# Patient Record
Sex: Female | Born: 1980 | Race: White | Hispanic: No | Marital: Married | Smoking: Never smoker
Health system: Southern US, Community
[De-identification: ages and names within clinical notes are randomized; demographics above are authoritative.]

## PROBLEM LIST (undated history)

## (undated) ENCOUNTER — Inpatient Hospital Stay (HOSPITAL_COMMUNITY): Payer: Self-pay

## (undated) DIAGNOSIS — E669 Obesity, unspecified: Secondary | ICD-10-CM

## (undated) DIAGNOSIS — I499 Cardiac arrhythmia, unspecified: Secondary | ICD-10-CM

## (undated) HISTORY — PX: WISDOM TOOTH EXTRACTION: SHX21

## (undated) HISTORY — DX: Cardiac arrhythmia, unspecified: I49.9

## (undated) HISTORY — DX: Obesity, unspecified: E66.9

---

## 2011-09-26 ENCOUNTER — Other Ambulatory Visit: Payer: Self-pay | Admitting: Obstetrics & Gynecology

## 2011-09-26 DIAGNOSIS — Z0489 Encounter for examination and observation for other specified reasons: Secondary | ICD-10-CM

## 2011-10-03 ENCOUNTER — Other Ambulatory Visit: Payer: Self-pay | Admitting: Obstetrics & Gynecology

## 2011-10-03 ENCOUNTER — Encounter (HOSPITAL_COMMUNITY): Payer: Self-pay

## 2011-10-03 ENCOUNTER — Ambulatory Visit (HOSPITAL_COMMUNITY)
Admission: RE | Admit: 2011-10-03 | Discharge: 2011-10-03 | Disposition: A | Discharge status: Home / Self Care | Payer: 59 | Source: Ambulatory Visit | Attending: Obstetrics & Gynecology | Admitting: Obstetrics & Gynecology

## 2011-10-03 DIAGNOSIS — Z3689 Encounter for other specified antenatal screening: Secondary | ICD-10-CM | POA: Insufficient documentation

## 2011-10-03 DIAGNOSIS — Z0489 Encounter for examination and observation for other specified reasons: Secondary | ICD-10-CM

## 2011-10-03 LAB — OB RESULTS CONSOLE HIV ANTIBODY (ROUTINE TESTING): HIV: NONREACTIVE

## 2011-10-28 LAB — OB RESULTS CONSOLE ABO/RH

## 2011-10-28 LAB — OB RESULTS CONSOLE RUBELLA ANTIBODY, IGM: Rubella: IMMUNE

## 2011-11-21 ENCOUNTER — Other Ambulatory Visit: Payer: Self-pay | Admitting: Obstetrics & Gynecology

## 2011-11-21 DIAGNOSIS — IMO0002 Reserved for concepts with insufficient information to code with codable children: Secondary | ICD-10-CM

## 2011-11-28 ENCOUNTER — Telehealth (HOSPITAL_COMMUNITY): Payer: Self-pay | Admitting: *Deleted

## 2011-11-28 ENCOUNTER — Encounter (HOSPITAL_COMMUNITY): Payer: Self-pay | Admitting: *Deleted

## 2011-11-28 NOTE — Telephone Encounter (Signed)
Preadmission screen  

## 2011-11-30 ENCOUNTER — Ambulatory Visit (HOSPITAL_BASED_OUTPATIENT_CLINIC_OR_DEPARTMENT_OTHER): Payer: 59 | Attending: Nurse Practitioner

## 2011-11-30 VITALS — Ht 67.0 in | Wt 305.0 lb

## 2011-11-30 DIAGNOSIS — G4733 Obstructive sleep apnea (adult) (pediatric): Secondary | ICD-10-CM | POA: Insufficient documentation

## 2011-12-03 ENCOUNTER — Encounter (HOSPITAL_BASED_OUTPATIENT_CLINIC_OR_DEPARTMENT_OTHER): Payer: 59

## 2011-12-04 ENCOUNTER — Inpatient Hospital Stay (HOSPITAL_COMMUNITY): Admission: RE | Admit: 2011-12-04 | Payer: 59 | Source: Ambulatory Visit

## 2011-12-05 ENCOUNTER — Ambulatory Visit (HOSPITAL_COMMUNITY): Payer: 59

## 2011-12-06 DIAGNOSIS — G4733 Obstructive sleep apnea (adult) (pediatric): Secondary | ICD-10-CM

## 2011-12-06 NOTE — Procedures (Signed)
NAME:  Rose Meyer, Rose Meyer                 ACCOUNT NO.:  [id number]  MEDICAL RECORD NO.:  [id number]          PATIENT TYPE:  OUT  LOCATION:  SLEEP CENTER                 FACILITY:  [facility]  PHYSICIAN:  Clinton D. Maple Hudson, MD, FCCP, FACPDATE OF BIRTH:  January 22, 1979  DATE OF STUDY:  11/30/2011                           NOCTURNAL POLYSOMNOGRAM  REFERRING PHYSICIAN:  Dorise Hiss NEWSOME  REFERRING PHYSICIAN:  Dr. Willette Cluster.  INDICATION FOR STUDY:  Hypersomnia with sleep apnea.  EPWORTH SLEEPINESS SCORE:  11/24.  BMI 48.  Weight 305 pounds.  Height 67 inches.  Neck 16 inches.  MEDICATIONS:  Home medications are charted and reviewed.  SLEEP ARCHITECTURE:  Total sleep time 333 minutes with sleep efficiency 88.4%.  Stage I was 4.4%, stage II 76.7%, stage III 5.6%, REM 13.4% of total sleep time.  Sleep latency 6.5 minutes, REM latency 90.5 minutes, awake after sleep onset 31.5 minutes.  Arousal index 7.2.  BEDTIME MEDICATION:  Omeprazole.  RESPIRATORY DATA:  Apnea-hypopnea index (AHI) 11.2 per hour.  A total of 62 events was scored including 14 obstructive apneas and 48 hypopneas. Events were seen in all sleep positions.  REM AHI 35.1 per hour.  This was a diagnostic NPSG protocol and CPAP titration was not done.  OXYGEN DATA:  Loud snoring with oxygen desaturation to a nadir of 83% and mean oxygen saturation through the study of 94.5% on room air.  CARDIAC DATA:  Sinus rhythm.  MOVEMENT-PARASOMNIA:  No significant movement disturbance.  Bathroom x1.  IMPRESSIONS-RECOMMENDATIONS: 1. Mild obstructive sleep apnea/hypopnea syndrome, AHI 11.2 per hour     with non-positional events.  Loud snoring with oxygen desaturation     to a nadir of 83% and mean oxygen saturation through the study of     94.5% on room air. 2. This was a diagnostic NPSG protocol without CPAP titration.     Consider return for dedicated CPAP titration study through the     sleep disorder center if  appropriate.     Clinton D. Maple Hudson, MD, [facility], FACP Diplomate, American Board of Sleep Medicine    CDY/MEDQ  D:  12/06/2011 11:20:57  T:  12/06/2011 22:00:43  Job:  [id number]

## 2011-12-17 ENCOUNTER — Inpatient Hospital Stay (HOSPITAL_COMMUNITY)
Admission: AD | Admit: 2011-12-17 | Discharge: 2011-12-17 | Disposition: A | Discharge status: Home / Self Care | Payer: 59 | Source: Ambulatory Visit | Attending: Obstetrics | Admitting: Obstetrics

## 2011-12-17 ENCOUNTER — Inpatient Hospital Stay (HOSPITAL_COMMUNITY): Payer: 59

## 2011-12-17 ENCOUNTER — Encounter (HOSPITAL_COMMUNITY): Payer: Self-pay | Admitting: *Deleted

## 2011-12-17 DIAGNOSIS — O322XX0 Maternal care for transverse and oblique lie, not applicable or unspecified: Secondary | ICD-10-CM | POA: Insufficient documentation

## 2011-12-17 DIAGNOSIS — O479 False labor, unspecified: Secondary | ICD-10-CM | POA: Insufficient documentation

## 2011-12-17 NOTE — MAU Note (Signed)
Dr. Tamela Oddi notified pt back from u/s, baby transverse lie, head at maternal left. Orders to d/c home, f/u in office any day this week.

## 2011-12-17 NOTE — MAU Note (Signed)
Dr. Tamela Oddi notified pt in MAU for labor eval, cervix ft, 40%, not contracting, unsure of presentation, order for u/s for presentation.

## 2011-12-17 NOTE — MAU Note (Signed)
Pt states pain began 2300, baby was vertex then felt sharp movement ?this past week. Denies bleeding or lof.

## 2011-12-17 NOTE — MAU Note (Signed)
PT SAYS  SHE STARTED HURT BAD AT 2300.   NOT BEEN IN MAU AT ALL. NO VE IN  OFFICE.   DENIES HSV  AND MRSA. 00

## 2011-12-18 ENCOUNTER — Inpatient Hospital Stay (HOSPITAL_COMMUNITY): Payer: 59

## 2011-12-18 ENCOUNTER — Encounter (HOSPITAL_COMMUNITY): Payer: Self-pay

## 2011-12-18 ENCOUNTER — Encounter (HOSPITAL_COMMUNITY)
Admission: RE | Disposition: A | Discharge status: Home / Self Care | Payer: Self-pay | Source: Ambulatory Visit | Attending: Obstetrics & Gynecology

## 2011-12-18 ENCOUNTER — Inpatient Hospital Stay (HOSPITAL_COMMUNITY)
Admission: RE | Admit: 2011-12-18 | Discharge: 2011-12-22 | DRG: 766 | Disposition: A | Discharge status: Home / Self Care | Payer: 59 | Source: Ambulatory Visit | Attending: Obstetrics & Gynecology | Admitting: Obstetrics & Gynecology

## 2011-12-18 ENCOUNTER — Encounter (HOSPITAL_COMMUNITY): Payer: Self-pay | Admitting: Anesthesiology

## 2011-12-18 ENCOUNTER — Inpatient Hospital Stay (HOSPITAL_COMMUNITY): Payer: 59 | Admitting: Anesthesiology

## 2011-12-18 VITALS — BP 106/69 | HR 72 | Temp 98.2°F | Resp 20 | Ht 67.0 in | Wt 305.8 lb

## 2011-12-18 DIAGNOSIS — O99892 Other specified diseases and conditions complicating childbirth: Secondary | ICD-10-CM | POA: Diagnosis present

## 2011-12-18 DIAGNOSIS — IMO0001 Reserved for inherently not codable concepts without codable children: Secondary | ICD-10-CM | POA: Diagnosis not present

## 2011-12-18 DIAGNOSIS — I471 Supraventricular tachycardia, unspecified: Secondary | ICD-10-CM | POA: Diagnosis present

## 2011-12-18 DIAGNOSIS — O34219 Maternal care for unspecified type scar from previous cesarean delivery: Secondary | ICD-10-CM | POA: Diagnosis present

## 2011-12-18 DIAGNOSIS — O321XX0 Maternal care for breech presentation, not applicable or unspecified: Secondary | ICD-10-CM | POA: Diagnosis not present

## 2011-12-18 DIAGNOSIS — O322XX0 Maternal care for transverse and oblique lie, not applicable or unspecified: Principal | ICD-10-CM | POA: Diagnosis present

## 2011-12-18 DIAGNOSIS — I498 Other specified cardiac arrhythmias: Secondary | ICD-10-CM | POA: Diagnosis present

## 2011-12-18 DIAGNOSIS — O480 Post-term pregnancy: Secondary | ICD-10-CM | POA: Diagnosis present

## 2011-12-18 LAB — CBC
HCT: 37.2 % (ref 36.0–46.0)
Hemoglobin: 12.6 g/dL (ref 12.0–15.0)
RBC: 4.07 MIL/uL (ref 3.87–5.11)

## 2011-12-18 LAB — RPR: RPR Ser Ql: NONREACTIVE

## 2011-12-18 SURGERY — Surgical Case
Anesthesia: Epidural | Site: Abdomen | Wound class: Clean Contaminated

## 2011-12-18 MED ORDER — METOCLOPRAMIDE HCL 5 MG/ML IJ SOLN: 10.0000 mg | Freq: Three times a day (TID) | INTRAMUSCULAR | Status: DC | PRN

## 2011-12-18 MED ORDER — ZOLPIDEM TARTRATE 5 MG PO TABS: 5.0000 mg | ORAL_TABLET | Freq: Every evening | ORAL | Status: DC | PRN

## 2011-12-18 MED ORDER — ACEBUTOLOL HCL 200 MG PO CAPS
200.0000 mg | ORAL_CAPSULE | Freq: Every day | ORAL | Status: DC
  Filled 2011-12-18: qty 1

## 2011-12-18 MED ORDER — SCOPOLAMINE 1 MG/3DAYS TD PT72
MEDICATED_PATCH | TRANSDERMAL | Status: AC
  Filled 2011-12-18: qty 1

## 2011-12-18 MED ORDER — ONDANSETRON HCL 4 MG/2ML IJ SOLN
INTRAMUSCULAR | Status: AC
  Filled 2011-12-18: qty 2

## 2011-12-18 MED ORDER — MEPERIDINE HCL 25 MG/ML IJ SOLN
6.2500 mg | INTRAMUSCULAR | Status: AC | PRN
  Administered 2011-12-18 (×2): 6.25 mg via INTRAVENOUS

## 2011-12-18 MED ORDER — OXYTOCIN 10 UNIT/ML IJ SOLN
INTRAMUSCULAR | Status: AC
  Filled 2011-12-18: qty 1

## 2011-12-18 MED ORDER — DEXTROSE 5 % IV SOLN
3.0000 g | Freq: Once | INTRAVENOUS | Status: DC
  Filled 2011-12-18: qty 3000

## 2011-12-18 MED ORDER — NALBUPHINE HCL 10 MG/ML IJ SOLN
5.0000 mg | INTRAMUSCULAR | Status: DC | PRN
  Administered 2011-12-19 (×3): 5 mg via INTRAVENOUS
  Filled 2011-12-18 (×2): qty 1

## 2011-12-18 MED ORDER — MEPERIDINE HCL 25 MG/ML IJ SOLN
INTRAMUSCULAR | Status: AC
  Filled 2011-12-18: qty 1

## 2011-12-18 MED ORDER — EPHEDRINE 5 MG/ML INJ
10.0000 mg | INTRAVENOUS | Status: DC | PRN
  Filled 2011-12-18: qty 4

## 2011-12-18 MED ORDER — ACETAMINOPHEN 325 MG PO TABS: 650.0000 mg | ORAL_TABLET | ORAL | Status: DC | PRN

## 2011-12-18 MED ORDER — SODIUM BICARBONATE 8.4 % IV SOLN
INTRAVENOUS | Status: AC
  Filled 2011-12-18: qty 50

## 2011-12-18 MED ORDER — PHENYLEPHRINE 40 MCG/ML (10ML) SYRINGE FOR IV PUSH (FOR BLOOD PRESSURE SUPPORT)
80.0000 ug | PREFILLED_SYRINGE | INTRAVENOUS | Status: DC | PRN
  Filled 2011-12-18: qty 5

## 2011-12-18 MED ORDER — SIMETHICONE 80 MG PO CHEW: 80.0000 mg | CHEWABLE_TABLET | ORAL | Status: DC | PRN

## 2011-12-18 MED ORDER — IBUPROFEN 600 MG PO TABS
600.0000 mg | ORAL_TABLET | Freq: Four times a day (QID) | ORAL | Status: DC
  Administered 2011-12-19 (×3): 600 mg via ORAL
  Filled 2011-12-18 (×3): qty 1

## 2011-12-18 MED ORDER — NALOXONE HCL 1 MG/ML IJ SOLN: 1.0000 ug/kg/h | INTRAMUSCULAR | Status: DC | PRN

## 2011-12-18 MED ORDER — ONDANSETRON HCL 4 MG PO TABS: 4.0000 mg | ORAL_TABLET | ORAL | Status: DC | PRN

## 2011-12-18 MED ORDER — MORPHINE SULFATE (PF) 0.5 MG/ML IJ SOLN
INTRAMUSCULAR | Status: DC | PRN
  Administered 2011-12-18: 4 mg via EPIDURAL

## 2011-12-18 MED ORDER — ONDANSETRON HCL 4 MG/2ML IJ SOLN: 4.0000 mg | Freq: Three times a day (TID) | INTRAMUSCULAR | Status: DC | PRN

## 2011-12-18 MED ORDER — ONDANSETRON HCL 4 MG/2ML IJ SOLN: 4.0000 mg | INTRAMUSCULAR | Status: DC | PRN

## 2011-12-18 MED ORDER — IBUPROFEN 600 MG PO TABS: 600.0000 mg | ORAL_TABLET | Freq: Four times a day (QID) | ORAL | Status: DC | PRN

## 2011-12-18 MED ORDER — OXYTOCIN 40 UNITS IN LACTATED RINGERS INFUSION - SIMPLE MED
1.0000 m[IU]/min | INTRAVENOUS | Status: DC
  Administered 2011-12-18: 2 m[IU]/min via INTRAVENOUS
  Filled 2011-12-18: qty 1000

## 2011-12-18 MED ORDER — LACTATED RINGERS IV SOLN
INTRAVENOUS | Status: DC
  Administered 2011-12-18 (×2): via INTRAVENOUS

## 2011-12-18 MED ORDER — KETOROLAC TROMETHAMINE 60 MG/2ML IM SOLN
60.0000 mg | Freq: Once | INTRAMUSCULAR | Status: AC | PRN
  Administered 2011-12-18: 60 mg via INTRAMUSCULAR

## 2011-12-18 MED ORDER — NALOXONE HCL 0.4 MG/ML IJ SOLN: 0.4000 mg | INTRAMUSCULAR | Status: DC | PRN

## 2011-12-18 MED ORDER — MEPERIDINE HCL 25 MG/ML IJ SOLN
INTRAMUSCULAR | Status: DC | PRN
  Administered 2011-12-18: 12.5 mg via INTRAVENOUS

## 2011-12-18 MED ORDER — ACEBUTOLOL HCL 400 MG PO CAPS
400.0000 mg | ORAL_CAPSULE | Freq: Every day | ORAL | Status: DC
  Filled 2011-12-18: qty 1

## 2011-12-18 MED ORDER — OXYTOCIN 40 UNITS IN LACTATED RINGERS INFUSION - SIMPLE MED: 62.5000 mL/h | INTRAVENOUS | Status: DC

## 2011-12-18 MED ORDER — MENTHOL 3 MG MT LOZG: 1.0000 | LOZENGE | OROMUCOSAL | Status: DC | PRN

## 2011-12-18 MED ORDER — LIDOCAINE HCL (PF) 1 % IJ SOLN
INTRAMUSCULAR | Status: DC | PRN
  Administered 2011-12-18 (×2): 4 mL

## 2011-12-18 MED ORDER — FENTANYL 2.5 MCG/ML BUPIVACAINE 1/10 % EPIDURAL INFUSION (WH - ANES)
INTRAMUSCULAR | Status: DC | PRN
  Administered 2011-12-18: 14 mL/h via EPIDURAL

## 2011-12-18 MED ORDER — MORPHINE SULFATE 0.5 MG/ML IJ SOLN
INTRAMUSCULAR | Status: AC
  Filled 2011-12-18: qty 10

## 2011-12-18 MED ORDER — MORPHINE SULFATE (PF) 0.5 MG/ML IJ SOLN
INTRAMUSCULAR | Status: DC | PRN
  Administered 2011-12-18: .5 mg via INTRAVENOUS
  Administered 2011-12-18: .5 mg via EPIDURAL

## 2011-12-18 MED ORDER — LACTATED RINGERS IV SOLN
500.0000 mL | Freq: Once | INTRAVENOUS | Status: AC
  Administered 2011-12-18: 500 mL via INTRAVENOUS

## 2011-12-18 MED ORDER — LIDOCAINE HCL (PF) 1 % IJ SOLN: 30.0000 mL | INTRAMUSCULAR | Status: DC | PRN

## 2011-12-18 MED ORDER — TETANUS-DIPHTH-ACELL PERTUSSIS 5-2.5-18.5 LF-MCG/0.5 IM SUSP
0.5000 mL | Freq: Once | INTRAMUSCULAR | Status: DC
  Filled 2011-12-18: qty 0.5

## 2011-12-18 MED ORDER — SODIUM CHLORIDE 0.9 % IJ SOLN
3.0000 mL | INTRAMUSCULAR | Status: DC | PRN
  Administered 2011-12-19 (×2): 3 mL via INTRAVENOUS

## 2011-12-18 MED ORDER — DIPHENHYDRAMINE HCL 50 MG/ML IJ SOLN: 25.0000 mg | INTRAMUSCULAR | Status: DC | PRN

## 2011-12-18 MED ORDER — SCOPOLAMINE 1 MG/3DAYS TD PT72
1.0000 | MEDICATED_PATCH | Freq: Once | TRANSDERMAL | Status: AC
  Administered 2011-12-18: 1.5 mg via TRANSDERMAL

## 2011-12-18 MED ORDER — DEXTROSE 5 % IV SOLN
3.0000 g | INTRAVENOUS | Status: DC | PRN
  Administered 2011-12-18: 3 g via INTRAVENOUS

## 2011-12-18 MED ORDER — LACTATED RINGERS IV SOLN
INTRAVENOUS | Status: DC | PRN
  Administered 2011-12-18 (×2): via INTRAVENOUS

## 2011-12-18 MED ORDER — DIPHENHYDRAMINE HCL 50 MG/ML IJ SOLN: 12.5000 mg | INTRAMUSCULAR | Status: DC | PRN

## 2011-12-18 MED ORDER — DIPHENHYDRAMINE HCL 25 MG PO CAPS: 25.0000 mg | ORAL_CAPSULE | ORAL | Status: DC | PRN

## 2011-12-18 MED ORDER — OXYCODONE-ACETAMINOPHEN 5-325 MG PO TABS
1.0000 | ORAL_TABLET | ORAL | Status: DC | PRN
  Administered 2011-12-19 (×4): 1 via ORAL
  Administered 2011-12-20 – 2011-12-22 (×13): 2 via ORAL
  Filled 2011-12-18: qty 1
  Filled 2011-12-18 (×4): qty 2
  Filled 2011-12-18: qty 1
  Filled 2011-12-18 (×7): qty 2
  Filled 2011-12-18: qty 1
  Filled 2011-12-18 (×2): qty 2
  Filled 2011-12-18: qty 1

## 2011-12-18 MED ORDER — CITRIC ACID-SODIUM CITRATE 334-500 MG/5ML PO SOLN
ORAL | Status: AC
  Filled 2011-12-18: qty 15

## 2011-12-18 MED ORDER — PHENYLEPHRINE HCL 10 MG/ML IJ SOLN
INTRAMUSCULAR | Status: DC | PRN
  Administered 2011-12-18 (×3): 40 ug via INTRAVENOUS
  Administered 2011-12-18 (×2): 80 ug via INTRAVENOUS
  Administered 2011-12-18 (×2): 40 ug via INTRAVENOUS

## 2011-12-18 MED ORDER — METHYLERGONOVINE MALEATE 0.2 MG/ML IJ SOLN
INTRAMUSCULAR | Status: AC
  Filled 2011-12-18: qty 1

## 2011-12-18 MED ORDER — DIBUCAINE 1 % RE OINT: 1.0000 "application " | TOPICAL_OINTMENT | RECTAL | Status: DC | PRN

## 2011-12-18 MED ORDER — FENTANYL 2.5 MCG/ML BUPIVACAINE 1/10 % EPIDURAL INFUSION (WH - ANES)
14.0000 mL/h | INTRAMUSCULAR | Status: DC
  Filled 2011-12-18: qty 125

## 2011-12-18 MED ORDER — LIDOCAINE-EPINEPHRINE (PF) 2 %-1:200000 IJ SOLN
INTRAMUSCULAR | Status: AC
  Filled 2011-12-18: qty 20

## 2011-12-18 MED ORDER — LANOLIN HYDROUS EX OINT: 1.0000 "application " | TOPICAL_OINTMENT | CUTANEOUS | Status: DC | PRN

## 2011-12-18 MED ORDER — FENTANYL CITRATE 0.05 MG/ML IJ SOLN: 25.0000 ug | INTRAMUSCULAR | Status: DC | PRN

## 2011-12-18 MED ORDER — ONDANSETRON HCL 4 MG/2ML IJ SOLN: 4.0000 mg | Freq: Four times a day (QID) | INTRAMUSCULAR | Status: DC | PRN

## 2011-12-18 MED ORDER — DIPHENHYDRAMINE HCL 25 MG PO CAPS: 25.0000 mg | ORAL_CAPSULE | Freq: Four times a day (QID) | ORAL | Status: DC | PRN

## 2011-12-18 MED ORDER — NALBUPHINE HCL 10 MG/ML IJ SOLN: 5.0000 mg | INTRAMUSCULAR | Status: DC | PRN

## 2011-12-18 MED ORDER — PHENYLEPHRINE 40 MCG/ML (10ML) SYRINGE FOR IV PUSH (FOR BLOOD PRESSURE SUPPORT): 80.0000 ug | PREFILLED_SYRINGE | INTRAVENOUS | Status: DC | PRN

## 2011-12-18 MED ORDER — ONDANSETRON HCL 4 MG/2ML IJ SOLN
INTRAMUSCULAR | Status: DC | PRN
  Administered 2011-12-18: 4 mg via INTRAVENOUS

## 2011-12-18 MED ORDER — EPHEDRINE 5 MG/ML INJ: 10.0000 mg | INTRAVENOUS | Status: DC | PRN

## 2011-12-18 MED ORDER — WITCH HAZEL-GLYCERIN EX PADS: 1.0000 "application " | MEDICATED_PAD | CUTANEOUS | Status: DC | PRN

## 2011-12-18 MED ORDER — KETOROLAC TROMETHAMINE 60 MG/2ML IM SOLN
INTRAMUSCULAR | Status: AC
  Filled 2011-12-18: qty 2

## 2011-12-18 MED ORDER — LACTATED RINGERS IV SOLN
INTRAVENOUS | Status: DC | PRN
  Administered 2011-12-18: 20:00:00 via INTRAVENOUS

## 2011-12-18 MED ORDER — LACTATED RINGERS IV SOLN: 500.0000 mL | INTRAVENOUS | Status: DC | PRN

## 2011-12-18 MED ORDER — SODIUM BICARBONATE 8.4 % IV SOLN
INTRAVENOUS | Status: DC | PRN
  Administered 2011-12-18 (×2): 5 mL via EPIDURAL

## 2011-12-18 MED ORDER — KETOROLAC TROMETHAMINE 30 MG/ML IJ SOLN: 30.0000 mg | Freq: Four times a day (QID) | INTRAMUSCULAR | Status: AC | PRN

## 2011-12-18 MED ORDER — FLEET ENEMA 7-19 GM/118ML RE ENEM: 1.0000 | ENEMA | RECTAL | Status: DC | PRN

## 2011-12-18 MED ORDER — OXYTOCIN 40 UNITS IN LACTATED RINGERS INFUSION - SIMPLE MED: 62.5000 mL/h | INTRAVENOUS | Status: AC

## 2011-12-18 MED ORDER — SENNOSIDES-DOCUSATE SODIUM 8.6-50 MG PO TABS
2.0000 | ORAL_TABLET | Freq: Every day | ORAL | Status: DC
  Administered 2011-12-19 – 2011-12-21 (×4): 2 via ORAL

## 2011-12-18 MED ORDER — CITRIC ACID-SODIUM CITRATE 334-500 MG/5ML PO SOLN
30.0000 mL | ORAL | Status: DC | PRN
  Administered 2011-12-18: 30 mL via ORAL

## 2011-12-18 MED ORDER — SIMETHICONE 80 MG PO CHEW
80.0000 mg | CHEWABLE_TABLET | Freq: Three times a day (TID) | ORAL | Status: DC
  Administered 2011-12-19 – 2011-12-22 (×14): 80 mg via ORAL

## 2011-12-18 MED ORDER — OXYCODONE-ACETAMINOPHEN 5-325 MG PO TABS: 1.0000 | ORAL_TABLET | ORAL | Status: DC | PRN

## 2011-12-18 MED ORDER — PRENATAL MULTIVITAMIN CH
1.0000 | ORAL_TABLET | Freq: Every day | ORAL | Status: DC
  Administered 2011-12-19 – 2011-12-22 (×4): 1 via ORAL
  Filled 2011-12-18 (×4): qty 1

## 2011-12-18 MED ORDER — LACTATED RINGERS IV SOLN
INTRAVENOUS | Status: DC
  Administered 2011-12-19 (×2): via INTRAVENOUS

## 2011-12-18 MED ORDER — OXYTOCIN 10 UNIT/ML IJ SOLN
40.0000 [IU] | INTRAVENOUS | Status: DC | PRN
  Administered 2011-12-18: 40 [IU] via INTRAVENOUS

## 2011-12-18 MED ORDER — OXYTOCIN BOLUS FROM INFUSION: 500.0000 mL | INTRAVENOUS | Status: DC

## 2011-12-18 SURGICAL SUPPLY — 42 items
BENZOIN TINCTURE PRP APPL 2/3 (GAUZE/BANDAGES/DRESSINGS) IMPLANT
CANISTER WOUND CARE 500ML ATS (WOUND CARE) IMPLANT
CLOTH BEACON ORANGE TIMEOUT ST (SAFETY) ×2 IMPLANT
CONTAINER PREFILL 10% NBF 15ML (MISCELLANEOUS) IMPLANT
DRESSING TELFA 8X3 (GAUZE/BANDAGES/DRESSINGS) ×2 IMPLANT
DRSG COVADERM 4X10 (GAUZE/BANDAGES/DRESSINGS) ×2 IMPLANT
DRSG VAC ATS LRG SENSATRAC (GAUZE/BANDAGES/DRESSINGS) ×2 IMPLANT
DRSG VAC ATS MED SENSATRAC (GAUZE/BANDAGES/DRESSINGS) IMPLANT
DRSG VAC ATS SM SENSATRAC (GAUZE/BANDAGES/DRESSINGS) IMPLANT
DURAPREP 26ML APPLICATOR (WOUND CARE) ×2 IMPLANT
ELECT REM PT RETURN 9FT ADLT (ELECTROSURGICAL) ×2
ELECTRODE REM PT RTRN 9FT ADLT (ELECTROSURGICAL) ×1 IMPLANT
EXTRACTOR VACUUM M CUP 4 TUBE (SUCTIONS) IMPLANT
GAUZE SPONGE 4X4 12PLY STRL LF (GAUZE/BANDAGES/DRESSINGS) ×4 IMPLANT
GAUZE VASELINE 3X9 (GAUZE/BANDAGES/DRESSINGS) ×2 IMPLANT
GLOVE BIO SURGEON STRL SZ 6.5 (GLOVE) ×4 IMPLANT
GOWN PREVENTION PLUS LG XLONG (DISPOSABLE) ×6 IMPLANT
KIT ABG SYR 3ML LUER SLIP (SYRINGE) IMPLANT
NEEDLE HYPO 25X5/8 SAFETYGLIDE (NEEDLE) ×2 IMPLANT
NS IRRIG 1000ML POUR BTL (IV SOLUTION) ×2 IMPLANT
PACK C SECTION WH (CUSTOM PROCEDURE TRAY) ×2 IMPLANT
PAD ABD 7.5X8 STRL (GAUZE/BANDAGES/DRESSINGS) ×2 IMPLANT
PAD OB MATERNITY 4.3X12.25 (PERSONAL CARE ITEMS) IMPLANT
RTRCTR C-SECT PINK 25CM LRG (MISCELLANEOUS) ×2 IMPLANT
SLEEVE SCD COMPRESS KNEE MED (MISCELLANEOUS) IMPLANT
STAPLER VISISTAT 35W (STAPLE) ×2 IMPLANT
STRIP CLOSURE SKIN 1/2X4 (GAUZE/BANDAGES/DRESSINGS) ×2 IMPLANT
SUT MNCRL 0 VIOLET CTX 36 (SUTURE) ×2 IMPLANT
SUT MNCRL AB 3-0 PS2 27 (SUTURE) IMPLANT
SUT MONOCRYL 0 CTX 36 (SUTURE) ×2
SUT PDS AB 0 CTX 36 PDP370T (SUTURE) ×2 IMPLANT
SUT PLAIN 0 NONE (SUTURE) IMPLANT
SUT PLAIN 2 0 XLH (SUTURE) ×2 IMPLANT
SUT VIC AB 0 CTXB 36 (SUTURE) IMPLANT
SUT VIC AB 2-0 CT1 (SUTURE) ×2 IMPLANT
SUT VIC AB 2-0 CT1 27 (SUTURE) ×1
SUT VIC AB 2-0 CT1 TAPERPNT 27 (SUTURE) ×1 IMPLANT
SUT VIC AB 2-0 SH 27 (SUTURE)
SUT VIC AB 2-0 SH 27XBRD (SUTURE) IMPLANT
TOWEL OR 17X24 6PK STRL BLUE (TOWEL DISPOSABLE) ×4 IMPLANT
TRAY FOLEY CATH 14FR (SET/KITS/TRAYS/PACK) ×2 IMPLANT
WATER STERILE IRR 1000ML POUR (IV SOLUTION) IMPLANT

## 2011-12-18 NOTE — Op Note (Signed)
Cesarean Section Procedure Note   Rose Meyer   12/18/2011  Indications: Transverse lie, shoulder presentation, arm prolapsing in the vagina   Pre-operative Diagnosis: Transverse lie, shoulder presentation, arm prolapsing in the vagina, IUP @ term, active labor  Post-operative Diagnosis: Same   Surgeon: Roseanna Rainbow  Assistants: Coral Ceo A  Anesthesia: epidural  Procedure Details:  The patient was seen in the Holding Room. The risks, benefits, complications, treatment options, and expected outcomes were discussed with the patient. The patient concurred with the proposed plan, giving informed consent. The patient was identified as Rose Meyer and the procedure verified as C-Section Delivery.  After induction of anesthesia, the patient was draped and prepped in the usual sterile manner. A transverse incision was made and carried down through the subcutaneous tissue to the fascia. The fascial incision was made and extended transversely.  The peritoneum was identified and entered. The peritoneal incision was extended longitudinally.  A low transverse uterine incision was made. Delivered as a complete extraction was a  living newborn female infant(s). APGAR (1 MIN): 5   APGAR (5 MINS): 9       A cord ph was sent. The umbilical cord was clamped and cut cord. A sample was obtained for evaluation. The placenta was removed Intact and appeared normal.  The uterine incision was closed with running locked sutures of 1-0 Monocryl. A bladder flap was sharply created.  A second imbricating layer of the same suture was placed.  Hemostasis was observed. The paracolic gutters were irrigated. The parieto peritoneum was closed in a running fashion with 2-0 Vicryl.  The fascia was then reapproximated with running sutures of 0 Vicryl.  Interrupted sutures of O-Plain were placed in the subcutaneous layer.  The skin was closed with staples.  A negative pressure dressing was applied.  Instrument, sponge,  and needle counts were correct prior the abdominal closure and were correct at the conclusion of the case.    Findings:  See above   Estimated Blood Loss: 400 ml  Total IV Fluids: per Anesthesiology  Urine Output: per Anesthesiology  Specimens: Placenta  Complications: no complications  Disposition: PACU - hemodynamically stable.  Maternal Condition: stable   Baby condition / location:  nursery-stable    Signed: Surgeon(s): Antionette Char, MD Brock Bad, MD

## 2011-12-18 NOTE — Transfer of Care (Signed)
Immediate Anesthesia Transfer of Care Note  Patient: Rose Meyer  Procedure(s) Performed: Procedure(s) (LRB) with comments: CESAREAN SECTION (N/A)  Patient Location: PACU  Anesthesia Type:Epidural  Level of Consciousness: awake, alert  and oriented  Airway & Oxygen Therapy: Patient Spontanous Breathing  Post-op Assessment: Report given to PACU RN and Post -op Vital signs reviewed and stable  Post vital signs: Reviewed and stable  Complications: No apparent anesthesia complications

## 2011-12-18 NOTE — Anesthesia Postprocedure Evaluation (Signed)
Anesthesia Post Note  Patient: Rose Meyer  Procedure(s) Performed: Procedure(s) (LRB): CESAREAN SECTION (N/A)  Anesthesia type: Epidural  Patient location: PACU  Post pain: Pain level controlled  Post assessment: Post-op Vital signs reviewed  Last Vitals:  Filed Vitals:   12/18/11 2315  BP: 133/77  Pulse: 89  Temp:   Resp: 19    Post vital signs: stable  Level of consciousness: awake  Complications: No apparent anesthesia complications

## 2011-12-18 NOTE — Progress Notes (Signed)
A speculum was placed.  The vagina was prepped with Betadine.  The speculum was removed.  A Cooper double balloon was placed in the LUS,  The uterine balloon was filled w/40 ml crystalloid, the vaginal balloon was filled with 20 ml.

## 2011-12-18 NOTE — Anesthesia Preprocedure Evaluation (Addendum)
Anesthesia Evaluation  Patient identified by MRN, date of birth, ID band Patient awake    Reviewed: Allergy & Precautions, H&P , Patient's Chart, lab work & pertinent test results  Airway Mallampati: III TM Distance: >3 FB Neck ROM: Full    Dental No notable dental hx. (+) Teeth Intact   Pulmonary neg pulmonary ROS,  breath sounds clear to auscultation  Pulmonary exam normal       Cardiovascular + dysrhythmias Supra Ventricular Tachycardia Rhythm:Regular Rate:Normal  Controlled on Acebutalol   Neuro/Psych negative neurological ROS  negative psych ROS   GI/Hepatic Neg liver ROS, GERD-  Medicated and Controlled,  Endo/Other  negative endocrine ROSMorbid obesity  Renal/GU negative Renal ROS  negative genitourinary   Musculoskeletal negative musculoskeletal ROS (+)   Abdominal (+) + obese,   Peds  Hematology negative hematology ROS (+)   Anesthesia Other Findings   Reproductive/Obstetrics Malpresentation - SROM                          Anesthesia Physical Anesthesia Plan  ASA: III and emergent  Anesthesia Plan: Epidural   Post-op Pain Management:    Induction:   Airway Management Planned: Natural Airway  Additional Equipment:   Intra-op Plan:   Post-operative Plan:   Informed Consent:   Dental advisory given  Plan Discussed with: Anesthesiologist, CRNA and Surgeon  Anesthesia Plan Comments:        Anesthesia Quick Evaluation

## 2011-12-18 NOTE — Progress Notes (Signed)
Rose Meyer is a 31 y.o. [id number] at [redacted]w[redacted]d by LMP admitted for induction of labor due to successful ECV.  Subjective: Comfortable  Objective: BP 135/80  Pulse 83  Temp 98.2 F (36.8 C) (Oral)  Resp 18  Ht 5\' 6"  (1.676 m)  Wt 141.976 kg (313 lb)  BMI 50.52 kg/m2  SpO2 98%  LMP 03/13/2011      FHT:  FHR: 140 bpm, variability: moderate,  accelerations:  Present,  decelerations:  Absent UC:   irregular, every 5 minutes SVE:   Dilation: 5 Effacement (%): 50 Station: Ballotable Exam by:: hk  Labs: Lab Results  Component Value Date   WBC 9.4 12/18/2011   HGB 12.6 12/18/2011   HCT 37.2 12/18/2011   MCV 91.4 12/18/2011   PLT 194 12/18/2011  U/S at bedside: breech presentation  Assessment / Plan: Breech presentation, SROM, meconium staining   Anticipated MOD:  C/D; informed consent obtained  JACKSON-MOORE,Srihari Shellhammer A 12/18/2011, 7:05 PM

## 2011-12-18 NOTE — Anesthesia Procedure Notes (Signed)
Epidural Patient location during procedure: OB Start time: 12/18/2011 6:33 PM  Staffing Anesthesiologist: Charnay Nazario A. Performed by: anesthesiologist   Preanesthetic Checklist Completed: patient identified, site marked, surgical consent, pre-op evaluation, timeout performed, IV checked, risks and benefits discussed and monitors and equipment checked  Epidural Patient position: sitting Prep: site prepped and draped and DuraPrep Patient monitoring: continuous pulse ox and blood pressure Approach: midline Injection technique: LOR air  Needle:  Needle type: Tuohy  Needle gauge: 17 G Needle length: 9 cm and 9 Needle insertion depth: 5 cm cm Catheter type: closed end flexible Catheter size: 19 Gauge Catheter at skin depth: 10 cm Test dose: negative and Other  Assessment Events: blood not aspirated, injection not painful, no injection resistance, negative IV test and no paresthesia  Additional Notes Patient identified. Risks and benefits discussed including failed block, incomplete  Pain control, post dural puncture headache, nerve damage, paralysis, blood pressure Changes, nausea, vomiting, reactions to medications-both toxic and allergic and post Partum back pain. All questions were answered. Patient expressed understanding and wished to proceed. Sterile technique was used throughout procedure. Epidural site was Dressed with sterile barrier dressing. No paresthesias, signs of intravascular injection Or signs of intrathecal spread were encountered.  Patient was more comfortable after the epidural was dosed. Please see RN's note for documentation of vital signs and FHR which are stable.

## 2011-12-18 NOTE — H&P (Signed)
Rose Meyer is a 31 y.o. female presenting for IOL. Maternal Medical History:  Reason for admission: S/P ECF for a transverse lie.  Now for IOL.  H/O pregnancy-related SVT.  Fetal activity: Perceived fetal activity is normal.    Prenatal complications: Paroxysmal SVT    OB History    Grav Para Term Preterm Abortions TAB SAB Ect Mult Living   4 2 2  1  1   2      Past Medical History  Diagnosis Date  . Dysrhythmia     SVT  . Obese    Past Surgical History  Procedure Date  . Wisdom tooth extraction    Family History: family history is not on file. Social History:  reports that she has never smoked. She does not have any smokeless tobacco history on file. She reports that she does not drink alcohol or use illicit drugs.     Review of Systems  Constitutional: Negative for fever.  Eyes: Negative for blurred vision.  Respiratory: Negative for shortness of breath.   Gastrointestinal: Negative for vomiting.  Skin: Negative for rash.  Neurological: Negative for headaches.      Temperature 97.7 F (36.5 C), temperature source Oral, height 5\' 6"  (1.676 m), weight 141.976 kg (313 lb), last menstrual period 03/13/2011. Maternal Exam:  Abdomen: Patient reports no abdominal tenderness. Fetal presentation: vertex  Introitus: not evaluated.   Cervix: not evaluated.   Fetal Exam Fetal Monitor Review: Variability: moderate (6-25 bpm).   Pattern: accelerations present and no decelerations.    Fetal State Assessment: Category I - tracings are normal.     Physical Exam  Constitutional: She appears well-developed.  HENT:  Head: Normocephalic.  Neck: Neck supple. No thyromegaly present.  Cardiovascular: Normal rate and regular rhythm.   Respiratory: Breath sounds normal.  GI: Soft. Bowel sounds are normal.  Skin: No rash noted.    Prenatal labs: ABO, Rh: A/Positive/-- (10/01 0000) Antibody: Negative (10/01 0000) Rubella: Immune (10/01 0000) RPR: Nonreactive (10/01  0000)  HBsAg: Negative (10/01 0000)  HIV: Non-reactive (10/01 0000)  GBS: Negative (10/17 0000)   Assessment/Plan: Multipara @ term.  S/P ECV for a transverse lie.  Pregnancy complicated by paroxysmal SVT.  Unfavorable Bishop's score.  Admit Foley bulb IOL Telemetry during labor    JACKSON-MOORE,[name] 12/18/2011, 8:37 AM

## 2011-12-18 NOTE — Procedures (Signed)
An NST was reactive.  Informed consent was obtained. A bedside U/S confirmed a transverse lie with the fetal head on the maternal right.  The patient was placed in the left lateral decubitus position.  A forward roll was performed.  U/S documented a cephalic presentation.  The FHR was in the 140s after the procedure.

## 2011-12-19 ENCOUNTER — Encounter (HOSPITAL_COMMUNITY): Payer: Self-pay | Admitting: Obstetrics & Gynecology

## 2011-12-19 DIAGNOSIS — O34219 Maternal care for unspecified type scar from previous cesarean delivery: Secondary | ICD-10-CM | POA: Diagnosis not present

## 2011-12-19 LAB — CBC
Hemoglobin: 11.7 g/dL — ABNORMAL LOW (ref 12.0–15.0)
MCH: 31.1 pg (ref 26.0–34.0)
RBC: 3.76 MIL/uL — ABNORMAL LOW (ref 3.87–5.11)
WBC: 11.8 10*3/uL — ABNORMAL HIGH (ref 4.0–10.5)

## 2011-12-19 MED ORDER — ACEBUTOLOL HCL 200 MG PO CAPS
200.0000 mg | ORAL_CAPSULE | Freq: Every day | ORAL | Status: DC
  Filled 2011-12-19: qty 1

## 2011-12-19 MED ORDER — SODIUM CHLORIDE 0.9 % IV BOLUS (SEPSIS)
500.0000 mL | Freq: Once | INTRAVENOUS | Status: AC
  Administered 2011-12-19: 500 mL via INTRAVENOUS

## 2011-12-19 MED ORDER — METOPROLOL TARTRATE 25 MG PO TABS: 25.0000 mg | ORAL_TABLET | Freq: Two times a day (BID) | ORAL | Status: DC

## 2011-12-19 MED ORDER — ENOXAPARIN SODIUM 40 MG/0.4ML ~~LOC~~ SOLN
40.0000 mg | SUBCUTANEOUS | Status: DC
  Administered 2011-12-19 – 2011-12-21 (×3): 40 mg via SUBCUTANEOUS
  Filled 2011-12-19 (×4): qty 0.4

## 2011-12-19 MED ORDER — ACEBUTOLOL HCL 400 MG PO CAPS
400.0000 mg | ORAL_CAPSULE | Freq: Every day | ORAL | Status: DC
  Filled 2011-12-19: qty 1

## 2011-12-19 MED ORDER — METOPROLOL TARTRATE 25 MG PO TABS
25.0000 mg | ORAL_TABLET | Freq: Two times a day (BID) | ORAL | Status: DC
  Filled 2011-12-19 (×6): qty 1

## 2011-12-19 NOTE — Anesthesia Postprocedure Evaluation (Signed)
  Anesthesia Post-op Note  Patient: Rose Meyer  Procedure(s) Performed: Procedure(s) (LRB) with comments: CESAREAN SECTION (N/A)  Patient Location: PACU and Mother/Baby  Anesthesia Type:Epidural  Level of Consciousness: awake, alert , oriented and patient cooperative  Airway and Oxygen Therapy: Patient Spontanous Breathing  Post-op Pain: none  Post-op Assessment: Post-op Vital signs reviewed and Patient's Cardiovascular Status Stable  Post-op Vital Signs: Reviewed and stable  Complications: No apparent anesthesia complications

## 2011-12-19 NOTE — Anesthesia Postprocedure Evaluation (Signed)
  Anesthesia Post-op Note  Patient: Rose Meyer  Procedure(s) Performed: Procedure(s) (LRB) with comments: CESAREAN SECTION (N/A)  Patient Location: A-ICU  Anesthesia Type:Epidural  Level of Consciousness: awake  Airway and Oxygen Therapy: Patient Spontanous Breathing  Post-op Pain: mild  Post-op Assessment: Patient's Cardiovascular Status Stable and Respiratory Function Stable  Post-op Vital Signs: stable  Complications: No apparent anesthesia complications

## 2011-12-19 NOTE — Addendum Note (Signed)
Addendum  created 12/19/11 1523 by Renford Dills, CRNA   Modules edited:Notes Section

## 2011-12-19 NOTE — Addendum Note (Signed)
Addendum  created 12/19/11 0849 by Orlie Pollen, CRNA   Modules edited:Charges VN, Notes Section

## 2011-12-19 NOTE — Progress Notes (Signed)
1600 called SBar report to Vikki Ports, RN on MBU  For pt transfer to Rm #[phone] Alroy Bailiff, RN back to report delay in transfer d/t pt feeling faint in the BR & now receiving 2nd fluid bolus also hold Metoprolol for BP<120/80 per MD. Pt to transfer after bolus complete.

## 2011-12-19 NOTE — Progress Notes (Signed)
Patient ID: Rose Meyer, female   DOB: Jan 30, 1980, 31 y.o.   MRN: [id number] Subjective: POD# 1 s/p Cesarean Delivery.  Indications: breech  RH status/Rubella reviewed. Feeding: breast Patient reports tolerating PO.  Denies HA/SOB/C/P/N/V/dizziness.   Breast symptoms: no.  She reports vaginal bleeding as normal, without clots.  She is ambulating, urinating without difficulty.     Objective: Vital signs in last 24 hours: BP 106/52  Pulse 85  Temp 98.5 F (36.9 C) (Oral)  Resp 19  Ht 5\' 7"  (1.702 m)  Wt 138.71 kg (305 lb 12.8 oz)  BMI 47.90 kg/m2  SpO2 98%  LMP 03/13/2011  Breastfeeding? Unknown   Total I/O In: 1555 [P.O.:680; I.V.:875] Out: 400 [Urine:400]   Physical Exam:  General: alert CV: Regular rate and rhythm Resp: clear Abdomen: soft, nontender, normal bowel sounds Lochia: minimal Uterine Fundus: firm, below umbilicus, nontender Incision: wound vac in place Ext: extremities normal, atraumatic, no cyanosis or edema    Basename 12/19/11 0527 12/18/11 0755  HGB 11.7* 12.6  HCT 34.0* 37.2      Assessment/Plan: 31 y.o.  status post Cesarean section. POD# 1.   Doing well, stable.  Urine output improving--appears concentrated   Bolus 500 ml NS            Advance diet as tolerated Start po pain meds D/C foley  HLIV  Ambulate IS Routine post-op care  JACKSON-MOORE,Rose Meyer 12/19/2011, 2:21 PM

## 2011-12-19 NOTE — Progress Notes (Signed)
Dr Clearance Coots is notified about pt's low urine output 25 cc/hr. Pt's VSs are stable(BP109/44, HR 85, Spo2  96, RR14), moderate amount of bloody discharge, no output in wound vac, no complain of pain. No orders received at this moment, continue to monitor pt closely.

## 2011-12-19 NOTE — Progress Notes (Signed)
UR chart review completed.  

## 2011-12-20 MED ORDER — TRAMADOL HCL 50 MG PO TABS
50.0000 mg | ORAL_TABLET | Freq: Four times a day (QID) | ORAL | Status: DC | PRN
  Administered 2011-12-20 – 2011-12-22 (×7): 50 mg via ORAL
  Filled 2011-12-20 (×7): qty 1

## 2011-12-20 NOTE — Progress Notes (Signed)
Patient ID: Rose Meyer, female   DOB: February 05, 1980, 31 y.o.   MRN: [id number] Postop day 2 Vital signs normal Abdomen soft Incision clean Lochia moderate No complaints

## 2011-12-21 NOTE — Progress Notes (Signed)
Patient ID: Rose Meyer, female   DOB: February 20, 1980, 31 y.o.   MRN: [id number] Postop day 3 Vital signs normal Incision dry Legs negative Dr. Tamela Oddi auscultation skin x-ray

## 2011-12-22 LAB — TYPE AND SCREEN
ABO/RH(D): A POS
Antibody Screen: NEGATIVE
Unit division: 0

## 2011-12-22 MED ORDER — OXYCODONE-ACETAMINOPHEN 5-325 MG PO TABS: 1.0000 | ORAL_TABLET | Freq: Four times a day (QID) | ORAL | Status: DC | PRN

## 2011-12-22 MED ORDER — METOPROLOL TARTRATE 25 MG PO TABS: 25.0000 mg | ORAL_TABLET | Freq: Two times a day (BID) | ORAL | Status: DC

## 2011-12-22 NOTE — Discharge Summary (Signed)
  Obstetric Discharge Summary Reason for Admission: induction of labor Prenatal Procedures: ECV Intrapartum Procedures: cesarean: low cervical, transverse Postpartum Procedures: none Complications-Operative and Postpartum: none  Hemoglobin  Date Value Range Status  12/19/2011 11.7* 12.0 - 15.0 g/dL Final     HCT  Date Value Range Status  12/19/2011 34.0* 36.0 - 46.0 % Final    Physical Exam:  General: alert Lochia: appropriate Uterine: firm Incision: clean, dry and intact DVT Evaluation: No evidence of DVT seen on physical exam.  Discharge Diagnoses: Active Problems:  Supraventricular tachycardia, paroxysmal  Cephalic version  Postmaturity pregnancy, 40-[redacted] weeks gestation  Breech presentation  Previous cesarean delivery, delivered, with or without mention of antepartum condition   Discharge Information: Date: 12/22/2011 Activity: pelvic rest Diet: routine Medications:  Prior to Admission medications   Medication Sig Start Date End Date Taking? Authorizing Provider  Miconazole Nitrate (MONISTAT 1 DAY OR NIGHT VA) Place 1 tablet vaginally once.   Yes Historical Provider, MD  Multiple Vitamin (MULTIVITAMIN) tablet Take 1 tablet by mouth daily.   Yes Historical Provider, MD  metoprolol tartrate (LOPRESSOR) 25 MG tablet Take 1 tablet (25 mg total) by mouth 2 (two) times daily. 12/22/11   Antionette Char, MD  oxyCODONE-acetaminophen (PERCOCET/ROXICET) 5-325 MG per tablet Take 1-2 tablets by mouth every 6 (six) hours as needed (moderate - severe pain). 12/22/11   Antionette Char, MD    Condition: stable Instructions: refer to routine discharge instructions Discharge to: home Follow-up Information    Follow up with Antionette Char A, MD. Schedule an appointment as soon as possible for a visit in 2 weeks. [facility] list calling)    Contact information:   [address], [address] [place] [address] [phone]          Newborn Data: Live born  Information  for the patient's newborn:  Xolani, Degracia Girl Bergen [[id number]]  female ; APGAR (1 MIN): 5   APGAR (5 MINS): 9     Home with mother.  JACKSON-MOORE,Geffrey Michaelsen A 12/22/2011, 8:22 AM

## 2012-01-02 ENCOUNTER — Ambulatory Visit (HOSPITAL_COMMUNITY)
Admission: RE | Admit: 2012-01-02 | Discharge: 2012-01-02 | Disposition: A | Discharge status: Home / Self Care | Payer: 59 | Source: Ambulatory Visit | Attending: Obstetrics & Gynecology | Admitting: Obstetrics & Gynecology

## 2012-01-02 NOTE — Progress Notes (Signed)
Adult Lactation Consultation Outpatient Visit Note  Patient Name: Rose Meyer mother , Rose Meyer Date of Birth: Nov. 21, 2015, BW 8 lbs-14 oz Gestational Age at Delivery: [redacted]w[redacted]d Type of Delivery: C/S  Breastfeeding History: Frequency of Breastfeeding: Every 3-4 hours during the day, every 2-3 hours at night (10/day) Length of Feeding: "Rose stays latched to the breast for 1-1.5 hours/ feeding" according to mother Voids: 6-8/ day Stools: 4/ day "mustard colored and seedy" per mom  Supplementing / Method: Pumping:  Type of Pump: DEBP: Medela   Frequency: When Rose will not latch. Rose refused the breast last week, mother pumped and fed per bottle.   Volume:  3-5 oz/ pumping  Comments:  This is an experienced breastfeeding mother. She states not having any problems with breastfeeding her other daughters (now 33 1/2 and 33 1/2 years old) and cannot understand why she is struggling this time. She is here for a feeding evaluation. Rose has had a steady decrease in weight. BW 8-14, ped appointment 8-5 and supplement was recommended after breastfeeding, F/U ped appointment Rose yesterday, Rose had gained to 8-6 per mom. Today, weight was 8 lbs 1.5oz (3670)  Consultation Evaluation:  Initial Feeding Assessment: Pre-feed Weight: 8-1.5  (3670) Post-feed Weight: 8- 3.2 (3720) Amount Transferred: 50 ml Comments: Infant latched well with basic assistance and teaching  Additional Feeding Assessment: Pre-feed Weight:  Post-feed Weight: Amount Transferred: Comments: Rose placed to other breast but sleepy and uninterested in feeding  Additional Feeding Assessment: Pre-feed Weight: Post-feed Weight: Amount Transferred: Comments:  Total Breast milk Transferred this Visit: 50ml Total Supplement Given: none  Additional Interventions: Mother states Rose has not latched like this in the past week. This feeding, Rose fed well in a rhythmic pattern with audible swallows. Instructed  mother to compress the breast tissue to get more tissue and areola into the Rose's mouth. Mother has  large nipples. Rose is growing and opening her mouth wider than she has been which allowed for a deeper latch and better milk transfer. Breast softened after the feeding. Due to poor transfer, mother 's may may have decreased. Instructed to feed frequently with deep latch. Rose had been going up to 4 hours without feeding but would feed more often at night. Discussed ways to increaser milk volume by increasing feedings to q 2 hours, using breast massage to transfer more milk during the feeding and hand expression after feeding to drain the breast. Mother to feed the Rose any expressed milk until back to birth weight and steady weight gain.  Additional double electric pumping 3-4 times per day to enhance supply. Mother is motivated. She will continue to supplement with formula/ EBM when Rose does not appear to be satiated. Rose had a coordinated, strong suck when suck assessed with gloved finger. He extends his tongue over the gum line and curls tongue around finger. Mother's milk was in and freely flowed. If weight gain remains slow, also would recommend oral evaluation with a speech therapist.  Follow-Up Mother unable to come to [facility] appointment next week. Has ped appointment on 01-08-12 at [facility] with Dr. Conni Elliot. Encouraged mother to attend support group on 01-06-12 for weight check if unable to reschedule an appointment. Mother to call as needed.     Christella Hartigan M 01/02/2012, 10:41 AM

## 2012-01-05 ENCOUNTER — Ambulatory Visit (HOSPITAL_COMMUNITY): Payer: 59

## 2012-06-10 ENCOUNTER — Ambulatory Visit (INDEPENDENT_AMBULATORY_CARE_PROVIDER_SITE_OTHER): Payer: BC Managed Care – PPO | Admitting: *Deleted

## 2012-06-10 VITALS — BP 117/82 | HR 101 | Temp 98.3°F | Ht 67.0 in | Wt 272.0 lb

## 2012-06-10 DIAGNOSIS — Z3200 Encounter for pregnancy test, result unknown: Secondary | ICD-10-CM

## 2012-06-10 DIAGNOSIS — Z309 Encounter for contraceptive management, unspecified: Secondary | ICD-10-CM

## 2012-06-10 NOTE — Progress Notes (Signed)
Subjective:    Rose Meyer is a 32 y.o. female who presents for contraception counseling. Pt states previously on Depo-Provera and wants to restart. Pt reports last sexually active x 3 months ago. The patient has no complaints today. The patient is not currently sexually active. Pertinent past medical history: none. UPT done in office today x 3 due to 1st being positive, 2nd and 3rd were both un determinate.  2nd opinion given by Glendell Docker, CMA.   Menstrual History: OB History   Grav Para Term Preterm Abortions TAB SAB Ect Mult Living   4 3 3  1  1   3       Menarche age: 29  No LMP recorded. Patient is not currently having periods (Reason: Lactating).    The following portions of the patient's history were reviewed and updated as appropriate: allergies, current medications, past family history, past medical history, past social history, past surgical history and problem list.  Review of Systems not examined.   Objective:    No exam performed today, nurse visit only .   Assessment:    32 y.o., wanting to start  Depo-Provera injections.  Plan:    Pt to abstain and return in 2 weeks for next UPT and depo if negative.

## 2012-06-10 NOTE — Patient Instructions (Signed)
Please return to office as schedule and as needed. Please call pharmacy for refills and pick-up prescription before next appointment.  

## 2012-06-23 ENCOUNTER — Ambulatory Visit (INDEPENDENT_AMBULATORY_CARE_PROVIDER_SITE_OTHER): Payer: BC Managed Care – PPO | Admitting: *Deleted

## 2012-06-23 DIAGNOSIS — Z3202 Encounter for pregnancy test, result negative: Secondary | ICD-10-CM

## 2012-06-23 DIAGNOSIS — IMO0001 Reserved for inherently not codable concepts without codable children: Secondary | ICD-10-CM

## 2012-06-23 NOTE — Progress Notes (Signed)
Patient is here today for her 2nd UPT and Depo injection.  UPT - negative today.  Patient forgot to bring injection and will come back tomorrow.

## 2012-06-24 ENCOUNTER — Ambulatory Visit: Payer: BC Managed Care – PPO

## 2012-06-24 ENCOUNTER — Ambulatory Visit (INDEPENDENT_AMBULATORY_CARE_PROVIDER_SITE_OTHER): Payer: BC Managed Care – PPO | Admitting: *Deleted

## 2012-06-24 VITALS — BP 118/82 | HR 91 | Wt 275.0 lb

## 2012-06-24 DIAGNOSIS — IMO0001 Reserved for inherently not codable concepts without codable children: Secondary | ICD-10-CM

## 2012-06-24 DIAGNOSIS — Z309 Encounter for contraceptive management, unspecified: Secondary | ICD-10-CM

## 2012-06-24 MED ORDER — MEDROXYPROGESTERONE ACETATE 150 MG/ML IM SUSP
150.0000 mg | INTRAMUSCULAR | Status: AC
  Administered 2012-06-24 – 2013-05-26 (×3): 150 mg via INTRAMUSCULAR

## 2012-06-24 NOTE — Progress Notes (Signed)
Pt in office today for a Depo injection. Pt had a pregnancy test in office yesterday and the results were negative. Pt due in for next injection September 15, 2012

## 2012-09-13 ENCOUNTER — Other Ambulatory Visit: Payer: Self-pay | Admitting: *Deleted

## 2012-09-13 DIAGNOSIS — Z309 Encounter for contraceptive management, unspecified: Secondary | ICD-10-CM

## 2012-09-13 MED ORDER — MEDROXYPROGESTERONE ACETATE 150 MG/ML IM SUSP: 150.0000 mg | INTRAMUSCULAR | Status: DC

## 2012-09-14 ENCOUNTER — Ambulatory Visit (INDEPENDENT_AMBULATORY_CARE_PROVIDER_SITE_OTHER): Payer: BC Managed Care – PPO | Admitting: *Deleted

## 2012-09-14 VITALS — BP 131/83 | HR 94 | Temp 98.1°F | Wt 271.0 lb

## 2012-09-14 DIAGNOSIS — Z3049 Encounter for surveillance of other contraceptives: Secondary | ICD-10-CM

## 2012-09-14 NOTE — Progress Notes (Signed)
Pt in office for Depo Injection. Pt is on time for injection.  Pt given Depo and patient tolerated well.

## 2012-12-10 ENCOUNTER — Other Ambulatory Visit: Payer: Self-pay | Admitting: *Deleted

## 2012-12-10 ENCOUNTER — Ambulatory Visit: Payer: BC Managed Care – PPO

## 2012-12-10 DIAGNOSIS — Z309 Encounter for contraceptive management, unspecified: Secondary | ICD-10-CM

## 2012-12-10 MED ORDER — MEDROXYPROGESTERONE ACETATE 150 MG/ML IM SUSP: 150.0000 mg | INTRAMUSCULAR | Status: DC

## 2012-12-13 ENCOUNTER — Ambulatory Visit (INDEPENDENT_AMBULATORY_CARE_PROVIDER_SITE_OTHER): Payer: BC Managed Care – PPO | Admitting: *Deleted

## 2012-12-13 ENCOUNTER — Ambulatory Visit: Payer: BC Managed Care – PPO

## 2012-12-13 VITALS — BP 117/77 | HR 93 | Temp 97.5°F | Wt 275.0 lb

## 2012-12-13 DIAGNOSIS — Z309 Encounter for contraceptive management, unspecified: Secondary | ICD-10-CM

## 2012-12-13 NOTE — Progress Notes (Signed)
Patient is in the office for her Depo Provera injection. Patient is breast feeding with light bleeding without pattern.

## 2013-02-15 ENCOUNTER — Ambulatory Visit (INDEPENDENT_AMBULATORY_CARE_PROVIDER_SITE_OTHER): Payer: BC Managed Care – PPO | Admitting: Cardiology

## 2013-02-15 ENCOUNTER — Encounter: Payer: Self-pay | Admitting: General Surgery

## 2013-02-15 ENCOUNTER — Encounter: Payer: Self-pay | Admitting: Cardiology

## 2013-02-15 VITALS — BP 122/74 | HR 70 | Ht 67.0 in | Wt 277.0 lb

## 2013-02-15 DIAGNOSIS — I471 Supraventricular tachycardia: Secondary | ICD-10-CM

## 2013-02-15 DIAGNOSIS — R002 Palpitations: Secondary | ICD-10-CM

## 2013-02-15 MED ORDER — PROPRANOLOL HCL 10 MG PO TABS: 10.0000 mg | ORAL_TABLET | ORAL | Status: DC | PRN

## 2013-02-15 NOTE — Patient Instructions (Signed)
Your physician has recommended you make the following change in your medication: Start Propanolol 10 Mg daily as needed. We sent this into your pharmacy for you, and should be ready for you to pick up later today.  Your physician recommends that you schedule a follow-up appointment in: 2 Months with Dr Mayford Knifeurner

## 2013-02-15 NOTE — Progress Notes (Signed)
  [address] [place], [place]  [address] Phone: [phone] Fax:  [phone]  Date:  02/15/2013   ID:  Rose Meyer, DOB 09/11/1980, MRN [id number]  PCP:  No primary provider on file.  Cardiologist:  Armanda Magicraci Lev Cervone ,MD     History of Present Illness: Rose Meyer is a 33 y.o. female with a history of SVT who presents today for evaluation. She was recently diagnosed with ADD and she was concerned that most of the medications are stimulants and she is concerned that it might affect her SVT.  She occasionally still has some palpitations once monthly lasting about 5 minutes and resolves spontaneously.  She denies any chest pain, SOB, LE edema, dizziness or syncope.    Wt Readings from Last 3 Encounters:  02/15/13 277 lb (125.646 kg)  12/13/12 275 lb (124.739 kg)  09/14/12 271 lb (122.925 kg)     Past Medical History  Diagnosis Date  . Dysrhythmia     SVT  . Obese     Current Outpatient Prescriptions  Medication Sig Dispense Refill  . medroxyPROGESTERone (DEPO-PROVERA) 150 MG/ML injection Inject 1 mL (150 mg total) into the muscle every 3 (three) months.  1 mL  3   Current Facility-Administered Medications  Medication Dose Route Frequency Provider Last Rate Last Dose  . medroxyPROGESTERone (DEPO-PROVERA) injection 150 mg  150 mg Intramuscular Q90 days Antionette CharLisa Jackson-Moore, MD   150 mg at 06/24/12 1032    Allergies:   No Known Allergies  Social History:  The patient  reports that she has never smoked. She has never used smokeless tobacco. She reports that she does not drink alcohol or use illicit drugs.   Family History:  The patient's family history includes Heart attack in her maternal grandfather.   ROS:  Please see the history of present illness.      All other systems reviewed and negative.   PHYSICAL EXAM: VS:  BP 122/74  Pulse 70  Ht 5\' 7"  (1.702 m)  Wt 277 lb (125.646 kg)  BMI 43.37 kg/m2 Well nourished, well developed, in no acute distress HEENT:  normal Neck: no JVD Cardiac:  normal S1, S2; RRR; no murmur Lungs:  clear to auscultation bilaterally, no wheezing, rhonchi or rales Abd: soft, nontender, no hepatomegaly Ext: no edema Skin: warm and dry Neuro:  CNs 2-12 intact, no focal abnormalities noted       ASSESSMENT AND PLAN:  1. SVT- she has very brief episodes of palpitations once monthly that are very well tolerated and spontaneously resolve.  She is concerned that the new ADD medication could trigger more SVT.   I have recommended that we give her a prescription for propranolol 10mg  to use as needed for SVT.  I have counseled her extensively that if she needs to start taking the Propranolol that she cannot breast feed any further.  She understands the risks involved to the baby and agrees to stop if starting the beta blocker.  I have told her to go ahead and start the ADD medication.  If she has any SVT she will let me know.  We also talked about SVT ablation but she wants to hold off on that for now.  Followup with me in 2 months  Signed, Armanda Magicraci Aunica Dauphinee, MD 02/15/2013 10:54 AM

## 2013-02-21 ENCOUNTER — Telehealth: Payer: Self-pay | Admitting: General Surgery

## 2013-02-21 NOTE — Telephone Encounter (Signed)
I spoke with Pt and she stated that her PCP is starting her on a low dosage and they will work their way to a higher dose. She wanted to know if Dr Mayford Knifeurner wanted the EKG now or when they had her on the higher dosage that she would get up to.  To Dr Mayford Knifeurner to advise.

## 2013-02-21 NOTE — Telephone Encounter (Signed)
I would like  A baseline

## 2013-02-23 NOTE — Telephone Encounter (Signed)
Pt aware and scheduled.

## 2013-03-03 ENCOUNTER — Other Ambulatory Visit: Payer: Self-pay | Admitting: *Deleted

## 2013-03-03 ENCOUNTER — Ambulatory Visit (INDEPENDENT_AMBULATORY_CARE_PROVIDER_SITE_OTHER): Payer: BC Managed Care – PPO | Admitting: *Deleted

## 2013-03-03 VITALS — BP 130/87 | HR 82 | Temp 98.4°F | Ht 67.0 in | Wt 274.0 lb

## 2013-03-03 DIAGNOSIS — Z3049 Encounter for surveillance of other contraceptives: Secondary | ICD-10-CM

## 2013-03-03 DIAGNOSIS — Z309 Encounter for contraceptive management, unspecified: Secondary | ICD-10-CM

## 2013-03-03 MED ORDER — MEDROXYPROGESTERONE ACETATE 150 MG/ML IM SUSP: 150.0000 mg | INTRAMUSCULAR | Status: DC

## 2013-03-03 NOTE — Progress Notes (Signed)
Patient in office today for a Depo injection. Patient is on time for her injection.  Patient to return to office May 25, 2013.

## 2013-03-07 ENCOUNTER — Ambulatory Visit (INDEPENDENT_AMBULATORY_CARE_PROVIDER_SITE_OTHER): Payer: BC Managed Care – PPO | Admitting: *Deleted

## 2013-03-07 DIAGNOSIS — R002 Palpitations: Secondary | ICD-10-CM

## 2013-03-07 DIAGNOSIS — I471 Supraventricular tachycardia: Secondary | ICD-10-CM

## 2013-03-07 NOTE — Patient Instructions (Signed)
EKG complete and given to dr turner's nurse.

## 2013-03-08 ENCOUNTER — Encounter: Payer: Self-pay | Admitting: Cardiology

## 2013-03-08 ENCOUNTER — Telehealth: Payer: Self-pay | Admitting: Cardiology

## 2013-03-08 NOTE — Telephone Encounter (Signed)
EKG reviewed and shows NSR with no ST changes and stable QTc at .  OK for ADD medicine

## 2013-03-08 NOTE — Telephone Encounter (Signed)
Pt made aware

## 2013-03-08 NOTE — Telephone Encounter (Signed)
This encounter was created in error - please disregard.

## 2013-04-15 ENCOUNTER — Encounter: Payer: Self-pay | Admitting: Cardiology

## 2013-04-15 ENCOUNTER — Ambulatory Visit (INDEPENDENT_AMBULATORY_CARE_PROVIDER_SITE_OTHER): Payer: BC Managed Care – PPO | Admitting: Cardiology

## 2013-04-15 VITALS — BP 121/80 | HR 80 | Ht 67.0 in | Wt 269.4 lb

## 2013-04-15 DIAGNOSIS — I471 Supraventricular tachycardia: Secondary | ICD-10-CM

## 2013-04-15 NOTE — Progress Notes (Signed)
  [address] [place], [place]  [address] Phone: [phone] Fax:  [phone]  Date:  04/15/2013   ID:  Rose Meyer, DOB 22-Jul-1980, MRN [id number]  PCP:  Default, Provider, MD  Cardiologist:  Armanda Magicraci Turner, MD     History of Present Illness: Rose Antonioolly Limpert is a 33 y.o. female with a history of SVT who presents today for evaluation. She was recently diagnosed with ADD and she was concerned that most of the medications are stimulants and she is concerned that it might affect her SVT. She occasionally still has some palpitations once monthly lasting about 5 minutes and resolves spontaneously. She denies any chest pain, SOB, LE edema, dizziness or syncope. When I last saw her we decided to give her a prescription for Propranolol to use as needed.  Her QTc on her EKG was fine.  She was told at that time it was ok to start her ADD medication.  She now presents back for followup.  She is doing well.  She denies any chest pain, SOB, DOE, LE edema or palpitations.  Since going on the ADD medication she has felt well and has not had any problems with palpitations.      Wt Readings from Last 3 Encounters:  04/15/13 269 lb 6.4 oz (122.199 kg)  03/03/13 274 lb (124.286 kg)  02/15/13 277 lb (125.646 kg)     Past Medical History  Diagnosis Date  . Dysrhythmia     SVT  . Obese     Current Outpatient Prescriptions  Medication Sig Dispense Refill  . amphetamine-dextroamphetamine (ADDERALL XR) 25 MG 24 hr capsule Take 25 mg by mouth every morning.      . propranolol (INDERAL) 10 MG tablet Take 1 tablet (10 mg total) by mouth as needed.  15 tablet  5   Current Facility-Administered Medications  Medication Dose Route Frequency Provider Last Rate Last Dose  . medroxyPROGESTERone (DEPO-PROVERA) injection 150 mg  150 mg Intramuscular Q90 days Antionette CharLisa Jackson-Moore, MD   150 mg at 03/03/13 1447    Allergies:   No Known Allergies  Social History:  The patient  reports that she has never  smoked. She has never used smokeless tobacco. She reports that she does not drink alcohol or use illicit drugs.   Family History:  The patient's family history includes Heart attack in her maternal grandfather.   ROS:  Please see the history of present illness.      All other systems reviewed and negative.   PHYSICAL EXAM: VS:  BP 121/80  Pulse 80  Ht 5\' 7"  (1.702 m)  Wt 269 lb 6.4 oz (122.199 kg)  BMI 42.18 kg/m2 Well nourished, well developed, in no acute distress HEENT: normal Neck: no JVD Cardiac:  normal S1, S2; RRR; no murmur Lungs:  clear to auscultation bilaterally, no wheezing, rhonchi or rales Abd: soft, nontender, no hepatomegaly Ext: no edema Skin: warm and dry Neuro:  CNs 2-12 intact, no focal abnormalities noted       ASSESSMENT AND PLAN:  1. SVT with no reoccurrence while on ADD medication - she ran out of her Adderall 3 days ago so I will get an EKG on her in a week after she is back on it  Followup with me in 6 months  Signed, Armanda Magicraci Turner, MD 04/15/2013 10:27 AM

## 2013-04-15 NOTE — Patient Instructions (Addendum)
Your physician recommends that you continue on your current medications as directed. Please refer to the Current Medication list given to you today.  Your physician recommends that you schedule a follow-up appointment in: You are scheduled to come back for a EKG with Nurse on 04/29/13 at 10:00 AM  Your physician wants you to follow-up in: 6 Months with Dr Sherlyn Lickurner You will receive a reminder letter in the mail two months in advance. If you don't receive a letter, please call our office to schedule the follow-up appointment.

## 2013-04-29 ENCOUNTER — Telehealth: Payer: Self-pay | Admitting: *Deleted

## 2013-04-29 ENCOUNTER — Ambulatory Visit (INDEPENDENT_AMBULATORY_CARE_PROVIDER_SITE_OTHER): Payer: BC Managed Care – PPO | Admitting: *Deleted

## 2013-04-29 VITALS — BP 112/78 | HR 102 | Wt 269.8 lb

## 2013-04-29 DIAGNOSIS — I471 Supraventricular tachycardia: Secondary | ICD-10-CM

## 2013-04-29 DIAGNOSIS — I498 Other specified cardiac arrhythmias: Secondary | ICD-10-CM

## 2013-04-29 NOTE — Progress Notes (Signed)
1.) Reason for visit: EKG after starting ADD medication  2.) Name of MD requesting visit: Dr. Armanda Magicraci Turner  3.) H&P: History of SVT, ADD  4.) ROS related to problem: No c/o or symptoms. Started on Addererall 1 week ago  Assessment and plan per MD: EKG done and read by Dr. Sanjuana KavaMcAlhaney (DOD); showed Sinus Tachycardia; no changes in treatment 5.) Provider sign-of(MD statement):

## 2013-04-29 NOTE — Telephone Encounter (Signed)
Pt in for EKG this AM after starting back on her ADD medication.  Showed sinus tachycardia.  She would like to discuss a referral to EP for her SVT.  Advised would send information to Dr. Mayford Knifeurner for her to decide if appropriate for referral now.

## 2013-04-29 NOTE — Patient Instructions (Signed)
No change in treatment per Dr. Sanjuana KavaMcAlhaney.  Will forward EKG results to Dr. Armanda Magicraci Turner

## 2013-05-01 NOTE — Telephone Encounter (Signed)
I am fine with referral to EP for consideration of SVT ablation

## 2013-05-02 NOTE — Telephone Encounter (Signed)
Pt has appt with Dr. Ladona Ridgelaylor of 05/27/13.

## 2013-05-02 NOTE — Telephone Encounter (Signed)
Per Dr. Mayford Knifeurner may refer to EP for consideration of SVT ablation

## 2013-05-26 ENCOUNTER — Ambulatory Visit (INDEPENDENT_AMBULATORY_CARE_PROVIDER_SITE_OTHER): Payer: BC Managed Care – PPO | Admitting: *Deleted

## 2013-05-26 ENCOUNTER — Other Ambulatory Visit: Payer: Self-pay | Admitting: *Deleted

## 2013-05-26 ENCOUNTER — Other Ambulatory Visit: Payer: Self-pay

## 2013-05-26 VITALS — BP 111/78 | HR 88 | Temp 98.4°F | Wt 268.0 lb

## 2013-05-26 DIAGNOSIS — Z309 Encounter for contraceptive management, unspecified: Secondary | ICD-10-CM

## 2013-05-26 DIAGNOSIS — Z3042 Encounter for surveillance of injectable contraceptive: Secondary | ICD-10-CM

## 2013-05-26 MED ORDER — MEDROXYPROGESTERONE ACETATE 150 MG/ML IM SUSP: 150.0000 mg | INTRAMUSCULAR | Status: DC

## 2013-05-26 NOTE — Progress Notes (Signed)
Pt is in office today for Depo injection.  Pt is on time for her injection.  Given in right deltoid.  Pt tolerated well. Pt advised to RTO on July 22,2015.  BP 111/78  Pulse 88  Temp(Src) 98.4 F (36.9 C)  Wt 268 lb (121.564 kg)    Administrations This Visit   medroxyPROGESTERone (DEPO-PROVERA) injection 150 mg   Administered Action Dose Route Administered By   05/26/2013 Given 150 mg Intramuscular Lanney GinsSuzanne D Ravinder Lukehart, CMA

## 2013-05-27 ENCOUNTER — Ambulatory Visit (INDEPENDENT_AMBULATORY_CARE_PROVIDER_SITE_OTHER): Payer: BC Managed Care – PPO | Admitting: Internal Medicine

## 2013-05-27 ENCOUNTER — Encounter: Payer: Self-pay | Admitting: Internal Medicine

## 2013-05-27 VITALS — BP 127/87 | HR 112 | Ht 67.0 in | Wt 264.0 lb

## 2013-05-27 DIAGNOSIS — I471 Supraventricular tachycardia: Secondary | ICD-10-CM

## 2013-05-27 NOTE — Progress Notes (Signed)
      HPI [name] is referred today by Dr. Mayford Knifeurner for evaluation of SVT. She notes symptoms dating back to her pregnancy. She states that she had documented SVT when she wore a cardiac monitor while pregnant [place]. Since then we do not have any documentation. Her SVT was initially brief and infrequent but now starts and stops suddenly and may occur several times a day. She has noted that her symptoms have worsened since she began taking Adderall for ADD. Her episodes last minutes and start and stop suddenly. No Known Allergies   Current Outpatient Prescriptions  Medication Sig Dispense Refill  . amphetamine-dextroamphetamine (ADDERALL XR) 25 MG 24 hr capsule Take 25 mg by mouth every morning.      . propranolol (INDERAL) 10 MG tablet Take 1 tablet (10 mg total) by mouth as needed.  15 tablet  5   Current Facility-Administered Medications  Medication Dose Route Frequency Provider Last Rate Last Dose  . medroxyPROGESTERone (DEPO-PROVERA) injection 150 mg  150 mg Intramuscular Q90 days Antionette CharLisa Jackson-Moore, MD   150 mg at 05/26/13 1612     Past Medical History  Diagnosis Date  . Dysrhythmia     SVT  . Obese     ROS:   All systems reviewed and negative except as noted in the HPI.   Past Surgical History  Procedure Laterality Date  . Wisdom tooth extraction    . Cesarean section  12/18/2011    Procedure: CESAREAN SECTION;  Surgeon: Antionette CharLisa Jackson-Moore, MD;  Location: [facility];  Service: Obstetrics;  Laterality: N/A;     Family History  Problem Relation Age of Onset  . Heart attack Maternal Grandfather      History   Social History  . Marital Status: Married    Spouse Name: N/A    Number of Children: 3  . Years of Education: N/A   Occupational History  . Not on file.   Social History Main Topics  . Smoking status: Never Smoker   . Smokeless tobacco: Never Used  . Alcohol Use: No  . Drug Use: No  . Sexual Activity: Yes    Partners: Male      Birth Control/ Protection: Injection   Other Topics Concern  . Not on file   Social History Narrative  . No narrative on file     BP 127/87  Pulse 112  Ht 5\' 7"  (1.702 m)  Wt 264 lb (119.75 kg)  BMI 41.34 kg/m2  Physical Exam:  Well appearing 33 yo woman, NAD HEENT: Unremarkable Neck:  No JVD, no thyromegally Back:  No CVA tenderness Lungs:  Clear with no wheezes, rales, or rhonchi HEART:  Regular rate rhythm, no murmurs, no rubs, no clicks Abd:  soft, positive bowel sounds, no organomegally, no rebound, no guarding Ext:  2 plus pulses, no edema, no cyanosis, no clubbing Skin:  No rashes no nodules Neuro:  CN II through XII intact, motor grossly intact  EKG - NSR with no pre-excitation   Assess/Plan:

## 2013-05-27 NOTE — Assessment & Plan Note (Signed)
I have discussed the treatment options with the patient. We need to obtain some documentation of her SVT. Once this has been accomplished, we can better consider catheter ablation. While I suspect she truly has SVT based on her symptoms, we need documentation. If her old monitor is unavailable, the we will repeat her 48 hour holter.

## 2013-05-27 NOTE — Patient Instructions (Signed)
Will make a plan after you get the monitor faxed to us  [phone]  [facility]/ Dr Ladona Ridgelaylor  Codes [id number] and [phone] for a 48 hour monitor

## 2013-08-18 ENCOUNTER — Ambulatory Visit: Payer: BC Managed Care – PPO

## 2013-08-18 ENCOUNTER — Ambulatory Visit (INDEPENDENT_AMBULATORY_CARE_PROVIDER_SITE_OTHER): Payer: BC Managed Care – PPO | Admitting: *Deleted

## 2013-08-18 VITALS — BP 134/83 | HR 98 | Temp 97.9°F | Ht 67.0 in | Wt 264.0 lb

## 2013-08-18 DIAGNOSIS — Z3042 Encounter for surveillance of injectable contraceptive: Secondary | ICD-10-CM

## 2013-08-18 DIAGNOSIS — Z3049 Encounter for surveillance of other contraceptives: Secondary | ICD-10-CM

## 2013-08-18 MED ORDER — MEDROXYPROGESTERONE ACETATE 150 MG/ML IM SUSP: 150.0000 mg | INTRAMUSCULAR | Status: DC

## 2013-08-18 MED ORDER — MEDROXYPROGESTERONE ACETATE 150 MG/ML IM SUSP
150.0000 mg | INTRAMUSCULAR | Status: DC
  Administered 2013-08-18 – 2013-11-10 (×2): 150 mg via INTRAMUSCULAR

## 2013-09-14 ENCOUNTER — Telehealth: Payer: Self-pay | Admitting: *Deleted

## 2013-09-14 ENCOUNTER — Other Ambulatory Visit: Payer: Self-pay | Admitting: *Deleted

## 2013-09-14 MED ORDER — PROPRANOLOL HCL 10 MG PO TABS: 10.0000 mg | ORAL_TABLET | Freq: Every day | ORAL | Status: DC | PRN

## 2013-09-14 NOTE — Telephone Encounter (Signed)
Rose GowdaGregg I will defer this to you since nothing about med changes was noted in your last OV

## 2013-09-14 NOTE — Telephone Encounter (Signed)
Patient called and stated that it was her understanding that Dr Ladona Ridgelaylor was going to change her propranolol to something that was longer acting. Please advise. Thanks, MI

## 2013-09-20 NOTE — Telephone Encounter (Signed)
Tresa Endo, change Mrs. Linehan to atenolol 50 mg daily. GT

## 2013-09-21 MED ORDER — ATENOLOL 50 MG PO TABS
50.0000 mg | ORAL_TABLET | Freq: Every day | ORAL | Status: DC
Start: 2013-09-21 — End: 2014-03-03

## 2013-09-21 NOTE — Telephone Encounter (Signed)
Spoke with patient and medication called in

## 2013-09-21 NOTE — Addendum Note (Signed)
Addended by: Dennis Bast F on: 09/21/2013 06:35 PM   Modules accepted: Orders, Medications

## 2013-10-14 ENCOUNTER — Ambulatory Visit: Payer: BC Managed Care – PPO | Admitting: Cardiology

## 2013-11-09 ENCOUNTER — Telehealth: Payer: Self-pay | Admitting: Obstetrics & Gynecology

## 2013-11-09 ENCOUNTER — Other Ambulatory Visit: Payer: Self-pay | Admitting: *Deleted

## 2013-11-09 DIAGNOSIS — Z3042 Encounter for surveillance of injectable contraceptive: Secondary | ICD-10-CM

## 2013-11-09 MED ORDER — MEDROXYPROGESTERONE ACETATE 150 MG/ML IM SUSP: 150.0000 mg | INTRAMUSCULAR | Status: DC

## 2013-11-09 NOTE — Telephone Encounter (Signed)
Task completed

## 2013-11-10 ENCOUNTER — Ambulatory Visit (INDEPENDENT_AMBULATORY_CARE_PROVIDER_SITE_OTHER): Payer: BC Managed Care – PPO | Admitting: *Deleted

## 2013-11-10 VITALS — BP 103/70 | HR 97 | Temp 98.6°F | Wt 249.0 lb

## 2013-11-10 DIAGNOSIS — Z3042 Encounter for surveillance of injectable contraceptive: Secondary | ICD-10-CM

## 2013-11-10 NOTE — Progress Notes (Signed)
Pt is in office today for depo injection.  Pt is on time for injection.  Pt tolerated injection well.    Pt advised to RTO on January 6,2016 for next injection.   BP 103/70  Pulse 97  Temp(Src) 98.6 F (37 C)  Wt 249 lb (112.946 kg)   Administrations This Visit   medroxyPROGESTERone (DEPO-PROVERA) injection 150 mg   Administered Action Dose Route Administered By   11/10/2013 Given 150 mg Intramuscular Lanney GinsSuzanne D Lucella Pommier, CMA

## 2013-11-28 ENCOUNTER — Encounter: Payer: Self-pay | Admitting: Internal Medicine

## 2013-12-01 ENCOUNTER — Ambulatory Visit (INDEPENDENT_AMBULATORY_CARE_PROVIDER_SITE_OTHER): Payer: BC Managed Care – PPO | Admitting: Obstetrics & Gynecology

## 2013-12-01 ENCOUNTER — Ambulatory Visit: Payer: BC Managed Care – PPO | Admitting: Obstetrics & Gynecology

## 2013-12-01 ENCOUNTER — Telehealth: Payer: Self-pay

## 2013-12-01 ENCOUNTER — Encounter: Payer: Self-pay | Admitting: Obstetrics & Gynecology

## 2013-12-01 VITALS — BP 117/79 | HR 84 | Temp 98.4°F | Ht 67.0 in | Wt 250.0 lb

## 2013-12-01 DIAGNOSIS — Z7689 Persons encountering health services in other specified circumstances: Secondary | ICD-10-CM

## 2013-12-01 DIAGNOSIS — Z01419 Encounter for gynecological examination (general) (routine) without abnormal findings: Secondary | ICD-10-CM

## 2013-12-01 NOTE — Telephone Encounter (Signed)
PATIENT TO CONTACT DR. Jocelyn [facility]BETTI REESE [facility]

## 2013-12-01 NOTE — Progress Notes (Signed)
Subjective:     Rose Meyer is a 33 y.o. female here for a routine exam.    Personal health questionnaire:  Is patient Rose Meyer, have a family history of breast and/or ovarian cancer: no Is there a family history of uterine cancer diagnosed at age < 8450, gastrointestinal cancer, urinary tract cancer, family member who is a Personnel officerLynch syndrome-associated Meyer: no Is the patient overweight and hypertensive, family history of diabetes, personal history of gestational diabetes or PCOS: yes Is patient over 3155, have PCOS,  family history of premature CHD under age 35, diabetes, smoke, have hypertension or peripheral artery disease:  no At any time, has a partner hit, kicked or otherwise hurt or frightened you?: no Over the past 2 weeks, have you felt down, depressed or hopeless?: no Over the past 2 weeks, have you felt little interest or pleasure in doing things?:no   Gynecologic History No LMP recorded. [name]. Contraception: Depo-Provera injections Last Pap results were: normal   Obstetric History OB History  Gravida Para Term Preterm AB SAB TAB Ectopic Multiple Living  4 3 3  1 1    3     # Outcome Date GA Lbr Len/2nd Weight Sex Delivery Anes PTL Lv  4 Term 12/18/11 8258w0d  4.03 kg (8 lb 14.2 oz) F CS-LVertical EPI  Y  3 Term 2012 561w0d 08:00 4.139 kg (9 lb 2 oz) F Vag-Spont EPI  Y  2 SAB 2010          1 Term 2009 4058w0d 06:00 4.139 kg (9 lb 2 oz) F Vag-Spont EPI  Y      Past Medical History  Diagnosis Date  . Dysrhythmia     SVT  . Obese     Past Surgical History  Procedure Laterality Date  . Wisdom tooth extraction    . Cesarean section  12/18/2011    Procedure: CESAREAN SECTION;  Surgeon: Antionette CharLisa Jackson-Moore, MD;  Location: [facility];  Service: Obstetrics;  Laterality: N/A;    Current outpatient prescriptions: amphetamine-dextroamphetamine (ADDERALL XR) 25 MG 24 hr capsule, Take 25 mg by mouth every morning., Disp: , Rfl: ;  atenolol (TENORMIN) 50 MG  tablet, Take 1 tablet (50 mg total) by mouth daily., Disp: 90 tablet, Rfl: 3;  medroxyPROGESTERone (DEPO-PROVERA) 150 MG/ML injection, Inject 1 mL (150 mg total) into the muscle every 3 (three) months., Disp: 1 mL, Rfl: 1 Current facility-administered medications: medroxyPROGESTERone (DEPO-PROVERA) injection 150 mg, 150 mg, Intramuscular, Q90 days, Antionette CharLisa Jackson-Moore, MD, 150 mg at 11/10/13 1102 No Known Allergies  History  Substance Use Topics  . Smoking status: Never Smoker   . Smokeless tobacco: Never Used  . Alcohol Use: No    Family History  Problem Relation Age of Onset  . Heart attack Maternal Grandfather       Review of Systems  Constitutional: negative for fatigue and weight loss Respiratory: negative for cough and wheezing Cardiovascular: negative for chest pain, fatigue and palpitations Gastrointestinal: negative for abdominal pain and change in bowel habits Musculoskeletal:negative for myalgias Neurological: negative for gait problems and tremors Behavioral/Psych: negative for abusive relationship, depression Endocrine: negative for temperature intolerance   Genitourinary:negative for abnormal menstrual periods, genital lesions, hot flashes, sexual problems and vaginal discharge Integument/breast: negative for breast lump, breast tenderness, nipple discharge and skin lesion(s)    Objective:       BP 117/79 mmHg  Pulse 84  Temp(Src) 98.4 F (36.9 C)  Ht 5\' 7"  (1.702 m)  Wt 113.399  kg (250 lb)  BMI 39.15 kg/m2 General:   alert  Skin:   no rash or abnormalities  Lungs:   clear to auscultation bilaterally  Heart:   regular rate and rhythm, S1, S2 normal, no murmur, click, rub or gallop  Breasts:   normal without suspicious masses, skin or nipple changes or axillary nodes  Abdomen:  normal findings: no organomegaly, soft, non-tender and no hernia  Pelvis:  External genitalia: normal general appearance Urinary system: urethral meatus normal and bladder without  fullness, nontender Vaginal: normal without tenderness, induration or masses Cervix: normal appearance Adnexa: normal bimanual exam Uterus: anteverted and non-tender, normal size   Lab Review  Labs reviewed no Radiologic studies reviewed no    Assessment:    Healthy female exam.    Plan:    Education reviewed: calcium supplements, low fat, low cholesterol diet and weight bearing exercise.    Orders Placed This Encounter  Procedures  . Urine culture  . CBC  . Hemoglobin A1c  . Urinalysis, Routine w reflex microscopic  . Urine Microscopic  . [facility]    Referral Priority:  Routine    Referral Type:  Consultation    Referral Reason:  Specialty Services Required    Requested Specialty:  Family Medicine    Number of Visits Requested:  1   Follow up as needed.

## 2013-12-02 LAB — URINALYSIS, MICROSCOPIC ONLY
CASTS: NONE SEEN
CRYSTALS: NONE SEEN

## 2013-12-02 LAB — CBC
HCT: 42.8 % (ref 36.0–46.0)
Hemoglobin: 14.7 g/dL (ref 12.0–15.0)
MCH: 30.1 pg (ref 26.0–34.0)
MCHC: 34.3 g/dL (ref 30.0–36.0)
MCV: 87.7 fL (ref 78.0–100.0)
PLATELETS: 317 10*3/uL (ref 150–400)
RBC: 4.88 MIL/uL (ref 3.87–5.11)
RDW: 13.1 % (ref 11.5–15.5)
WBC: 9 10*3/uL (ref 4.0–10.5)

## 2013-12-02 LAB — URINALYSIS, ROUTINE W REFLEX MICROSCOPIC
BILIRUBIN URINE: NEGATIVE
GLUCOSE, UA: NEGATIVE mg/dL
HGB URINE DIPSTICK: NEGATIVE
Ketones, ur: NEGATIVE mg/dL
Nitrite: NEGATIVE
PH: 6 (ref 5.0–8.0)
PROTEIN: NEGATIVE mg/dL
Specific Gravity, Urine: 1.021 (ref 1.005–1.030)
Urobilinogen, UA: 0.2 mg/dL (ref 0.0–1.0)

## 2013-12-02 LAB — HEMOGLOBIN A1C
Hgb A1c MFr Bld: 5.3 % (ref <5.7)
Mean Plasma Glucose: 105 mg/dL (ref <117)

## 2013-12-03 LAB — URINE CULTURE
COLONY COUNT: NO GROWTH
Organism ID, Bacteria: NO GROWTH

## 2013-12-03 NOTE — Patient Instructions (Signed)

## 2013-12-05 LAB — PAP IG AND HPV HIGH-RISK: HPV DNA HIGH RISK: NOT DETECTED

## 2013-12-29 ENCOUNTER — Ambulatory Visit: Payer: BC Managed Care – PPO | Admitting: Obstetrics & Gynecology

## 2014-01-12 ENCOUNTER — Encounter (HOSPITAL_COMMUNITY): Payer: Self-pay | Admitting: Emergency Medicine

## 2014-01-12 ENCOUNTER — Emergency Department (HOSPITAL_COMMUNITY): Payer: BC Managed Care – PPO

## 2014-01-12 ENCOUNTER — Emergency Department (HOSPITAL_COMMUNITY)
Admission: EM | Admit: 2014-01-12 | Discharge: 2014-01-12 | Disposition: A | Discharge status: Home / Self Care | Payer: BC Managed Care – PPO | Attending: Emergency Medicine | Admitting: Emergency Medicine

## 2014-01-12 DIAGNOSIS — R109 Unspecified abdominal pain: Secondary | ICD-10-CM | POA: Diagnosis present

## 2014-01-12 DIAGNOSIS — Z87440 Personal history of urinary (tract) infections: Secondary | ICD-10-CM | POA: Insufficient documentation

## 2014-01-12 DIAGNOSIS — I471 Supraventricular tachycardia: Secondary | ICD-10-CM | POA: Insufficient documentation

## 2014-01-12 DIAGNOSIS — Z79899 Other long term (current) drug therapy: Secondary | ICD-10-CM | POA: Insufficient documentation

## 2014-01-12 DIAGNOSIS — R6883 Chills (without fever): Secondary | ICD-10-CM | POA: Insufficient documentation

## 2014-01-12 DIAGNOSIS — E669 Obesity, unspecified: Secondary | ICD-10-CM | POA: Insufficient documentation

## 2014-01-12 DIAGNOSIS — R52 Pain, unspecified: Secondary | ICD-10-CM

## 2014-01-12 DIAGNOSIS — R61 Generalized hyperhidrosis: Secondary | ICD-10-CM | POA: Diagnosis not present

## 2014-01-12 DIAGNOSIS — N200 Calculus of kidney: Secondary | ICD-10-CM

## 2014-01-12 LAB — CBC WITH DIFFERENTIAL/PLATELET
Basophils Absolute: 0 10*3/uL (ref 0.0–0.1)
Basophils Relative: 0 % (ref 0–1)
Eosinophils Absolute: 0.1 10*3/uL (ref 0.0–0.7)
Eosinophils Relative: 2 % (ref 0–5)
HEMATOCRIT: 40.9 % (ref 36.0–46.0)
HEMOGLOBIN: 14 g/dL (ref 12.0–15.0)
LYMPHS PCT: 37 % (ref 12–46)
Lymphs Abs: 3.1 10*3/uL (ref 0.7–4.0)
MCH: 30.6 pg (ref 26.0–34.0)
MCHC: 34.2 g/dL (ref 30.0–36.0)
MCV: 89.3 fL (ref 78.0–100.0)
MONO ABS: 0.4 10*3/uL (ref 0.1–1.0)
Monocytes Relative: 5 % (ref 3–12)
Neutro Abs: 4.7 10*3/uL (ref 1.7–7.7)
Neutrophils Relative %: 56 % (ref 43–77)
PLATELETS: 289 10*3/uL (ref 150–400)
RBC: 4.58 MIL/uL (ref 3.87–5.11)
RDW: 12.3 % (ref 11.5–15.5)
WBC: 8.4 10*3/uL (ref 4.0–10.5)

## 2014-01-12 LAB — URINALYSIS, ROUTINE W REFLEX MICROSCOPIC
Bilirubin Urine: NEGATIVE
Glucose, UA: NEGATIVE mg/dL
KETONES UR: NEGATIVE mg/dL
LEUKOCYTES UA: NEGATIVE
Nitrite: NEGATIVE
PH: 5.5 (ref 5.0–8.0)
Protein, ur: NEGATIVE mg/dL
Specific Gravity, Urine: >1.03 — ABNORMAL HIGH (ref 1.005–1.030)
Urobilinogen, UA: 0.2 mg/dL (ref 0.0–1.0)

## 2014-01-12 LAB — BASIC METABOLIC PANEL
Anion gap: 12 (ref 5–15)
BUN: 20 mg/dL (ref 6–23)
CO2: 24 mEq/L (ref 19–32)
Calcium: 9.9 mg/dL (ref 8.4–10.5)
Chloride: 106 mEq/L (ref 96–112)
Creatinine, Ser: 0.78 mg/dL (ref 0.50–1.10)
GFR calc Af Amer: >90 mL/min (ref >90)
GFR calc non Af Amer: >90 mL/min (ref >90)
Glucose, Bld: 138 mg/dL — ABNORMAL HIGH (ref 70–99)
Potassium: 4.5 mEq/L (ref 3.7–5.3)
SODIUM: 142 meq/L (ref 137–147)

## 2014-01-12 LAB — URINE MICROSCOPIC-ADD ON

## 2014-01-12 LAB — POC URINE PREG, ED: Preg Test, Ur: NEGATIVE

## 2014-01-12 MED ORDER — OXYCODONE-ACETAMINOPHEN 5-325 MG PO TABS: 2.0000 | ORAL_TABLET | ORAL | Status: DC | PRN

## 2014-01-12 MED ORDER — HYDROMORPHONE HCL 1 MG/ML IJ SOLN
1.0000 mg | Freq: Once | INTRAMUSCULAR | Status: AC
  Administered 2014-01-12: 1 mg via INTRAVENOUS
  Filled 2014-01-12: qty 1

## 2014-01-12 MED ORDER — SODIUM CHLORIDE 0.9 % IV BOLUS (SEPSIS)
1000.0000 mL | Freq: Once | INTRAVENOUS | Status: AC
  Administered 2014-01-12: 1000 mL via INTRAVENOUS

## 2014-01-12 MED ORDER — FENTANYL CITRATE 0.05 MG/ML IJ SOLN
INTRAMUSCULAR | Status: AC
  Administered 2014-01-12: 100 ug via INTRAVENOUS
  Filled 2014-01-12: qty 2

## 2014-01-12 MED ORDER — ONDANSETRON HCL 4 MG/2ML IJ SOLN
4.0000 mg | Freq: Once | INTRAMUSCULAR | Status: AC
  Administered 2014-01-12: 4 mg via INTRAVENOUS
  Filled 2014-01-12: qty 2

## 2014-01-12 MED ORDER — FENTANYL CITRATE 0.05 MG/ML IJ SOLN
100.0000 ug | Freq: Once | INTRAMUSCULAR | Status: AC
Start: 2014-01-12 — End: 2014-01-12
  Administered 2014-01-12: 100 ug via INTRAVENOUS

## 2014-01-12 MED ORDER — SODIUM CHLORIDE 0.9 % IV SOLN: INTRAVENOUS | Status: DC

## 2014-01-12 NOTE — ED Provider Notes (Signed)
CSN: [id number]     Arrival date & time 01/12/14  2029 History  This chart was scribed for Rose BakerAnthony T Daniqua Campoy, MD by Bronson CurbJacqueline Melvin, ED Scribe. This patient was seen in room APA05/APA05 and the patient's care was started at 9:28 PM.   Chief Complaint  Patient presents with  . Flank Pain    The history is provided by the patient. No language interpreter was used.   HPI Comments: Rose Meyer is a 33 y.o. female who presents to the Emergency Department complaining of sudden onset, constant, right flank pain that began this afternoon. There is associated diaphoresis and chills. Patient has history of UTIs (last on 3 month ago in September 2015) and states this feels similar. She denies hematuria. Patient has taken ibuprofen PTA. She denies history of kidney stones or infection. Patient is currently on Depo and denies any chances of pregnancy.   Past Medical History  Diagnosis Date  . Dysrhythmia     SVT  . Obese    Past Surgical History  Procedure Laterality Date  . Wisdom tooth extraction    . Cesarean section  12/18/2011    Procedure: CESAREAN SECTION;  Surgeon: Antionette CharLisa Jackson-Moore, MD;  Location: [facility];  Service: Obstetrics;  Laterality: N/A;   Family History  Problem Relation Age of Onset  . Heart attack Maternal Grandfather    History  Substance Use Topics  . Smoking status: Never Smoker   . Smokeless tobacco: Never Used  . Alcohol Use: No   OB History    Gravida Para Term Preterm AB TAB SAB Ectopic Multiple Living   4 3 3  1  1   3      Review of Systems  Constitutional: Positive for chills and diaphoresis.  Genitourinary: Positive for flank pain (right). Negative for hematuria.  All other systems reviewed and are negative.     Allergies  Review of patient's allergies indicates no known allergies.  Home Medications   Prior to Admission medications   Medication Sig Start Date End Date Taking? Authorizing Provider  amphetamine-dextroamphetamine (ADDERALL XR) 30  MG 24 hr capsule Take 30 mg by mouth daily.   Yes Historical Provider, MD  atenolol (TENORMIN) 50 MG tablet Take 1 tablet (50 mg total) by mouth daily. 09/21/13  Yes Marinus MawGregg W Taylor, MD  ibuprofen (ADVIL,MOTRIN) 200 MG tablet Take 200 mg by mouth every 6 (six) hours as needed for mild pain or moderate pain.   Yes Historical Provider, MD  medroxyPROGESTERone (DEPO-PROVERA) 150 MG/ML injection Inject 1 mL (150 mg total) into the muscle every 3 (three) months. 11/09/13  Yes Antionette CharLisa Jackson-Moore, MD   Triage Vitals: BP 121/81 mmHg  Pulse 58  Temp(Src) 97.5 F (36.4 C) (Oral)  Resp 12  SpO2 100%  Physical Exam  Constitutional: She is oriented to person, place, and time. She appears well-developed and well-nourished.  Non-toxic appearance. No distress.  HENT:  Head: Normocephalic and atraumatic.  Eyes: Conjunctivae, EOM and lids are normal. Pupils are equal, round, and reactive to light.  Neck: Normal range of motion. Neck supple. No tracheal deviation present. No thyroid mass present.  Cardiovascular: Normal rate, regular rhythm and normal heart sounds.  Exam reveals no gallop.   No murmur heard. Pulmonary/Chest: Effort normal and breath sounds normal. No stridor. No respiratory distress. She has no decreased breath sounds. She has no wheezes. She has no rhonchi. She has no rales.  Abdominal: Soft. Normal appearance and bowel sounds are normal. She exhibits no distension. There  is no tenderness. There is CVA tenderness. There is no rebound.  Right CVA tenderness  Musculoskeletal: Normal range of motion. She exhibits no edema or tenderness.  Neurological: She is alert and oriented to person, place, and time. She has normal strength. No cranial nerve deficit or sensory deficit. GCS eye subscore is 4. GCS verbal subscore is 5. GCS motor subscore is 6.  Skin: Skin is warm and dry. No abrasion and no rash noted.  Psychiatric: She has a normal mood and affect. Her speech is normal and behavior is normal.   Nursing note and vitals reviewed.   ED Course  Procedures (including critical care time)  DIAGNOSTIC STUDIES: Oxygen Saturation is 100% on room air, normal by my interpretation.    COORDINATION OF CARE: At 2129 Discussed treatment plan with patient which includes UA, IV fluids, pain medication, nausea. Patient agrees.   Labs Review Labs Reviewed - No data to display  Imaging Review No results found.   EKG Interpretation None      MDM   Final diagnoses:  None    I personally performed the services described in this documentation, which was scribed in my presence. The recorded information has been reviewed and is accurate.  Patient given medications for pain here and feels better. CT scan consistent with passed kidney stone and she is stable for discharge.  Rose BakerAnthony T Dessa Ledee, MD 01/12/14 [phone]

## 2014-01-12 NOTE — Discharge Instructions (Signed)

## 2014-01-12 NOTE — ED Notes (Signed)
Pt c/o rt flank pain and groin pain.

## 2014-01-14 LAB — URINE CULTURE: Colony Count: 35000

## 2014-01-23 ENCOUNTER — Encounter: Payer: Self-pay | Admitting: *Deleted

## 2014-01-24 ENCOUNTER — Encounter: Payer: Self-pay | Admitting: Obstetrics & Gynecology

## 2014-02-02 ENCOUNTER — Ambulatory Visit: Payer: BC Managed Care – PPO

## 2014-02-02 ENCOUNTER — Telehealth: Payer: Self-pay | Admitting: *Deleted

## 2014-02-02 DIAGNOSIS — R002 Palpitations: Secondary | ICD-10-CM

## 2014-02-02 NOTE — Telephone Encounter (Signed)
Please get a 30 day heart monitor to see what the palpitations are

## 2014-02-02 NOTE — Telephone Encounter (Signed)
Left message to call back  

## 2014-02-02 NOTE — Telephone Encounter (Signed)
Patient called for a refill on propranolol, but she stated that she has actually been taking 20mg  qd instead of 10mg  prn. She feels that her symptoms are worsened due to being on adderall. Can she get a new rx for the 20mg  qd prn? Please advise. Thanks, MI

## 2014-02-03 NOTE — Telephone Encounter (Signed)
Left message to call back  

## 2014-02-06 NOTE — Telephone Encounter (Signed)
Left message to call back  

## 2014-02-08 NOTE — Telephone Encounter (Signed)
Informed patient of Dr. Norris Crossurner's recommendations for a 30 day event monitor.  Patient agrees with treatment plan. Event monitor ordered for scheduling.

## 2014-02-09 ENCOUNTER — Telehealth: Payer: Self-pay | Admitting: *Deleted

## 2014-02-09 NOTE — Telephone Encounter (Signed)
Patient called in with question of what codes would be billed for a 30 day cardiac event monitor.  Patient has [employer] insurance, so I gave her the codes for an event monitor hook up and interpretation.  [facility] for the code they would charge for an event monitor.  In the past patient had worn a monitor thru [facility] and had over a $1,000.00 out of pocket expense.  She has a $750.00 deductible and [employer] will only pay 80%.  Patient states, she cannot afford the monitor at this time, and cancelled her appointment.

## 2014-03-03 ENCOUNTER — Ambulatory Visit (INDEPENDENT_AMBULATORY_CARE_PROVIDER_SITE_OTHER): Payer: BLUE CROSS/BLUE SHIELD | Admitting: Cardiology

## 2014-03-03 ENCOUNTER — Encounter: Payer: Self-pay | Admitting: Cardiology

## 2014-03-03 VITALS — BP 142/96 | HR 96 | Ht 67.0 in | Wt 269.4 lb

## 2014-03-03 DIAGNOSIS — R002 Palpitations: Secondary | ICD-10-CM

## 2014-03-03 DIAGNOSIS — I471 Supraventricular tachycardia: Secondary | ICD-10-CM

## 2014-03-03 MED ORDER — ATENOLOL 50 MG PO TABS: 50.0000 mg | ORAL_TABLET | Freq: Every day | ORAL | Status: DC

## 2014-03-03 MED ORDER — PROPRANOLOL HCL 10 MG PO TABS: 10.0000 mg | ORAL_TABLET | Freq: Every day | ORAL | Status: DC | PRN

## 2014-03-03 NOTE — Patient Instructions (Addendum)
Your physician has recommended you make the following change in your medication:  1) START Atenolol 50 mg daily 2) Take your Propanolol daily AS NEEDED for breakthrough palpitations.   Your physician recommends that you schedule a follow-up appointment with Dr. Ladona Ridgelaylor.  Your physician wants you to follow-up in: 6 months with Dr. Mayford Knifeurner. You will receive a reminder letter in the mail two months in advance. If you don't receive a letter, please call our office to schedule the follow-up appointment.

## 2014-03-03 NOTE — Progress Notes (Signed)
Cardiology Office Note   Date:  03/03/2014   ID:  Rose Meyer, DOB [birth year], MRN [id number]  PCP:  Default, Provider, MD  Cardiologist:   Quintella Reichert, MD   Chief Complaint  Patient presents with  . Palpitations      History of Present Illness: Rose Meyer is a 34 y.o. female with a history of SVT who presents today for followup. She was recently diagnosed with ADD and she was concerned that most of the medications are stimulants and she is concerned that it might affect her SVT.  When I last saw her we decided to give her a prescription for Propranolol to use as needed. Her QTc on her EKG was fine. She was told at that time it was ok to start her ADD medication. Since going back on Adderral she has had a lot more palpitations.  She saw Dr. Ladona Ridgel in August for possible ablation and he recommended Atenolol.  She got this filled and took it for a month and then went back to propranolol but that is not holding her palptiations.  The palpitations have gotten much worse recently and HRs get up in the 200's similar to when she has had her SVT in the past.  She now presents back for followup.  At last OV I ordered a heart monitor but she was going to have to pay $1000 out of pocket and declined.  .   She denies any chest pain, SOB, DOE, LE edema. She does get SOB with the palpitations.  She denies any dizziness or syncope with the palpitations.      Past Medical History  Diagnosis Date  . Dysrhythmia     SVT  . Obese     Past Surgical History  Procedure Laterality Date  . Wisdom tooth extraction    . Cesarean section  12/18/2011    Procedure: CESAREAN SECTION;  Surgeon: Antionette Char, MD;  Location: [facility];  Service: Obstetrics;  Laterality: N/A;     Current Outpatient Prescriptions  Medication Sig Dispense Refill  . amphetamine-dextroamphetamine (ADDERALL XR) 30 MG 24 hr capsule Take 30 mg by mouth daily.    Marland Kitchen atenolol (TENORMIN) 50 MG tablet Take 1 tablet (50 mg  total) by mouth daily. 90 tablet 3  . ibuprofen (ADVIL,MOTRIN) 200 MG tablet Take 200 mg by mouth every 6 (six) hours as needed for mild pain or moderate pain.    Marland Kitchen propranolol (INDERAL) 10 MG tablet Take 10 mg by mouth daily.  5   No current facility-administered medications for this visit.    Allergies:   Review of patient's allergies indicates no known allergies.    Social History:  The patient  reports that she has never smoked. She has never used smokeless tobacco. She reports that she does not drink alcohol or use illicit drugs.   Family History:  The patient's family history includes Heart attack in her maternal grandfather.    ROS:  Please see the history of present illness.   Otherwise, review of systems are positive for none.   All other systems are reviewed and negative.    PHYSICAL EXAM: VS:  BP 142/96 mmHg  Pulse 96  Ht  (1.702 m)  Wt 269 lb 6.4 oz (122.199 kg)  BMI 42.18 kg/m2  SpO2 98% , BMI Body mass index is 42.18 kg/(m^2). GEN: Well nourished, well developed, in no acute distress HEENT: normal Neck: no JVD, carotid bruits, or masses Cardiac: RRR; no murmurs, rubs, or gallops,no  edema  Respiratory:  clear to auscultation bilaterally, normal work of breathing GI: soft, nontender, nondistended, + BS MS: no deformity or atrophy Skin: warm and dry, no rash Neuro:  Strength and sensation are intact Psych: euthymic mood, full affect   EKG:  EKG is not ordered today.    Recent Labs: 01/12/2014: BUN 20; Creatinine 0.78; Hemoglobin 14.0; Platelets 289; Potassium 4.5; Sodium 142    Lipid Panel No results found for: CHOL, TRIG, HDL, CHOLHDL, VLDL, LDLCALC, LDLDIRECT    Wt Readings from Last 3 Encounters:  03/03/14 269 lb 6.4 oz (122.199 kg)  12/01/13 250 lb (113.399 kg)  11/10/13 249 lb (112.946 kg)      Other studies Reviewed: Additional studies/ records that were reviewed today include: None.    ASSESSMENT AND PLAN:  1.  SVT now with  worsening symptoms of palpitations despite propranolol.  I have recommended that she go back on Atenolol as Dr. Ladona Ridgelaylor had recommended in August.  Will start at 50mg  daily. She will continue on propranolol for breakthrough palpitations.   I had ordered a heart monitor but she said she could not afford the out of pocked expense.  She has [employer].  I will refer her back to Dr. Ladona Ridgelaylor for consideration of ablation since she needs to be on ADD meds and it seems to be exacerbating her SVT.     Current medicines are reviewed at length with the patient today.  The patient does not have concerns regarding medicines.  The following changes have been made:  Atenolol 50mg  daily and use propranolol for breakthrough palpitations.  Labs/ tests ordered today include: none     Disposition:   FU with me in 6 months   Signed, Quintella ReichertURNER,TRACI R, MD  03/03/2014 11:04 AM    [facility] [address], [place], [place]  [address] Phone: [phone]; Fax: [phone]

## 2014-03-08 ENCOUNTER — Telehealth: Payer: Self-pay

## 2014-03-08 NOTE — Telephone Encounter (Signed)
Patient st she cannot afford monitor with insurance coverage. She is requesting an "in-house monitor" so it will be covered.

## 2014-05-19 ENCOUNTER — Encounter: Payer: Self-pay | Admitting: Internal Medicine

## 2014-05-19 ENCOUNTER — Ambulatory Visit (INDEPENDENT_AMBULATORY_CARE_PROVIDER_SITE_OTHER): Payer: BLUE CROSS/BLUE SHIELD | Admitting: Internal Medicine

## 2014-05-19 VITALS — BP 120/80 | HR 104 | Ht 67.0 in | Wt 279.4 lb

## 2014-05-19 DIAGNOSIS — R002 Palpitations: Secondary | ICD-10-CM | POA: Diagnosis not present

## 2014-05-19 DIAGNOSIS — I471 Supraventricular tachycardia: Secondary | ICD-10-CM | POA: Diagnosis not present

## 2014-05-19 NOTE — Patient Instructions (Signed)
Medication Instructions:  Your physician recommends that you continue on your current medications as directed. Please refer to the Current Medication list given to you today.    Labwork: None ordered  Testing/Procedures: Your physician has recommended that you have an ablation. Catheter ablation is a medical procedure used to treat some cardiac arrhythmias (irregular heartbeats). During catheter ablation, a long, thin, flexible tube is put into a blood vessel in your groin (upper thigh), or neck. This tube is called an ablation catheter. It is then guided to your heart through the blood vessel. Radio frequency waves destroy small areas of heart tissue where abnormal heartbeats may cause an arrhythmia to start. Please see the instruction sheet given to you today.---for SVT  427.1  CPT code 4098193653 for SVT ablation     Follow-Up: Your physician recommends that you schedule a follow-up appointment as needed  Call back if you decide to proceed with ablation   Any Other Special Instructions Will Be Listed Below (If Applicable).

## 2014-05-21 NOTE — Assessment & Plan Note (Signed)
Her symptoms are still present despite the use of 2 different beta blockers. She is encouraged to reduce her caffeine intake.

## 2014-05-21 NOTE — Progress Notes (Signed)
      HPI [name] returns today for ongoing evaluation of SVT. I saw her initially a year ago. She notes symptoms of SVT dating back to her pregnancy. She states that she had documented SVT when she wore a cardiac monitor while pregnant when she was living in [place]. Since then we do not have any documentation. Her SVT was initially brief and infrequent but now starts and stops suddenly and may occur several times a day. In the past she has received adenosine to terminate her SVT. She has noted that her symptoms have worsened since she began taking Adderall for ADD. Her episodes last minutes and start and stop suddenly. Since I saw her last, she has had multiple episodes. She has not sought medical attention No Known Allergies   Current Outpatient Prescriptions  Medication Sig Dispense Refill  . amphetamine-dextroamphetamine (ADDERALL XR) 30 MG 24 hr capsule Take 30 mg by mouth daily.    Marland Kitchen. atenolol (TENORMIN) 50 MG tablet Take 1 tablet (50 mg total) by mouth daily. 90 tablet 3  . loratadine (CLARITIN) 10 MG tablet Take 10 mg by mouth daily as needed for allergies (for allergies).    . propranolol (INDERAL) 10 MG tablet Take 10 mg by mouth as needed (for SVT's).     No current facility-administered medications for this visit.     Past Medical History  Diagnosis Date  . Dysrhythmia     SVT  . Obese     ROS:   All systems reviewed and negative except as noted in the HPI.   Past Surgical History  Procedure Laterality Date  . Wisdom tooth extraction    . Cesarean section  12/18/2011    Procedure: CESAREAN SECTION;  Surgeon: Antionette CharLisa Jackson-Moore, MD;  Location: [facility];  Service: Obstetrics;  Laterality: N/A;     Family History  Problem Relation Age of Onset  . Heart attack Maternal Grandfather      History   Social History  . Marital Status: Married    Spouse Name: N/A  . Number of Children: 3  . Years of Education: N/A   Occupational History  . Not on file.    Social History Main Topics  . Smoking status: Never Smoker   . Smokeless tobacco: Never Used  . Alcohol Use: No  . Drug Use: No  . Sexual Activity:    Partners: Male    Birth Control/ Protection: Injection   Other Topics Concern  . Not on file   Social History Narrative     BP 120/80 mmHg  Pulse 104  Ht 5\' 7"  (1.702 m)  Wt 279 lb 6.4 oz (126.735 kg)  BMI 43.75 kg/m2  Physical Exam:  Obese appearing 34 yo woman, NAD HEENT: Unremarkable Neck:  No JVD, no thyromegally Back:  No CVA tenderness Lungs:  Clear with no wheezes, rales, or rhonchi HEART:  Regular rate rhythm, no murmurs, no rubs, no clicks Abd:  soft, positive bowel sounds, no organomegally, no rebound, no guarding Ext:  2 plus pulses, no edema, no cyanosis, no clubbing Skin:  No rashes no nodules Neuro:  CN II through XII intact, motor grossly intact    Assess/Plan:

## 2014-05-21 NOTE — Assessment & Plan Note (Signed)
Her symptoms have worsened. I have recommended she undergo catheter ablation. She is considering her options and will call us if she wants to schedule her procedure.

## 2014-05-21 NOTE — Assessment & Plan Note (Signed)
She is encouraged to lose weight. She states that she will try once she has had an ablation.

## 2014-06-14 ENCOUNTER — Encounter: Payer: Self-pay | Admitting: Cardiology

## 2014-07-20 NOTE — Addendum Note (Signed)
Addended by: Galen Daft A on: 07/20/2014 11:11 AM   Modules accepted: Orders

## 2014-07-20 NOTE — Addendum Note (Signed)
Addended by: Galen Daft A on: 07/20/2014 10:51 AM   Modules accepted: Orders

## 2014-09-14 ENCOUNTER — Other Ambulatory Visit: Payer: Self-pay | Admitting: *Deleted

## 2014-09-14 MED ORDER — ATENOLOL 50 MG PO TABS: 50.0000 mg | ORAL_TABLET | Freq: Every day | ORAL | Status: DC

## 2014-10-25 NOTE — Progress Notes (Signed)
Cardiology Office Note   Date:  10/26/2014   ID:  Rose Meyer, DOB [birth year], MRN [id number]  PCP:  Default, Provider, MD    Chief Complaint  Patient presents with  . SVT      History of Present Illness: Rose Meyer is a 34 y.o. female with a history of SVT who presents today for followup. She saw Dr. Ladona Ridgel back in April and ablation was recommended but she never followed through. She is concerned about the ablation due to cost.  She says that the palpitations have settled down with the  of Atenolol which is usually around the time of her next dose.   She denies any chest pain, SOB, DOE.She denies any dizziness or syncope with the palpitations. She occasionally has some mild LE edema.      Past Medical History  Diagnosis Date  . Dysrhythmia     SVT  . Obese     Past Surgical History  Procedure Laterality Date  . Wisdom tooth extraction    . Cesarean section  12/18/2011    Procedure: CESAREAN SECTION;  Surgeon: Antionette Char, MD;  Location: [facility];  Service: Obstetrics;  Laterality: N/A;     Current Outpatient Prescriptions  Medication Sig Dispense Refill  . amphetamine-dextroamphetamine (ADDERALL XR) 10 MG 24 hr capsule Take 10 mg by mouth as needed. For ADD  0  . amphetamine-dextroamphetamine (ADDERALL XR) 30 MG 24 hr capsule Take 30 mg by mouth daily.    Marland Kitchen atenolol (TENORMIN) 50 MG tablet Take 1 tablet (50 mg total) by mouth daily. 90 tablet 0  . loratadine (CLARITIN) 10 MG tablet Take 10 mg by mouth daily as needed for allergies (for allergies).    . propranolol (INDERAL) 10 MG tablet Take 10 mg by mouth as needed (for SVT's).     No current facility-administered medications for this visit.    Allergies:   Review of patient's allergies indicates no known allergies.    Social History:  The patient  reports that she has never smoked. She has never used smokeless tobacco. She reports that she does not drink alcohol or use illicit  drugs.   Family History:  The patient's family history includes Heart attack in her maternal grandfather.    ROS:  Please see the history of present illness.   Otherwise, review of systems are positive for none.   All other systems are reviewed and negative.    PHYSICAL EXAM: VS:  BP 124/78 mmHg  Pulse 74  Ht  (1.702 m)  Wt 274 lb (124.286 kg)  BMI 42.90 kg/m2  SpO2 98% , BMI Body mass index is 42.9 kg/(m^2). GEN: Well nourished, well developed, in no acute distress HEENT: normal Neck: no JVD, carotid bruits, or masses Cardiac: RRR; no murmurs, rubs, or gallops,no edema  Respiratory:  clear to auscultation bilaterally, normal work of breathing GI: soft, nontender, nondistended, + BS MS: no deformity or atrophy Skin: warm and dry, no rash Neuro:  Strength and sensation are intact Psych: euthymic mood, full affect   EKG:  EKG is not ordered today.    Recent Labs: 01/12/2014: BUN 20; Creatinine, Ser 0.78; Hemoglobin 14.0; Platelets 289; Potassium 4.5; Sodium 142    Lipid Panel No results found for: CHOL, TRIG, HDL, CHOLHDL, VLDL, LDLCALC, LDLDIRECT    Wt Readings from Last 3 Encounters:  10/26/14 274 lb (124.286 kg)  05/19/14 279 lb  6.4 oz (126.735 kg)  03/03/14 269 lb 6.4 oz (122.199 kg)      ASSESSMENT AND PLAN:  1. SVT now with improved symptoms after increasing does of Atenolol.  Dr. Ladona Ridgel recommended ablation.  She is interested but cost is a factor.  She wants to wait until tax returns come through next year and consider it then.   2.  ADD  Current medicines are reviewed at length with the patient today.  The patient does not have concerns regarding medicines.  The following changes have been made:  no change  Labs/ tests ordered today: See above Assessment and Plan No orders of the defined types were placed in this encounter.     Disposition:   FU with me in 1 year  Signed, Quintella Reichert, MD  10/26/2014 2:11 PM    [facility] [address] [place], [place], [place]  [address] Phone: [phone]; Fax: [phone]

## 2014-10-26 ENCOUNTER — Ambulatory Visit (INDEPENDENT_AMBULATORY_CARE_PROVIDER_SITE_OTHER): Payer: BLUE CROSS/BLUE SHIELD | Admitting: Cardiology

## 2014-10-26 ENCOUNTER — Encounter: Payer: Self-pay | Admitting: Cardiology

## 2014-10-26 VITALS — BP 124/78 | HR 74 | Ht 67.0 in | Wt 274.0 lb

## 2014-10-26 DIAGNOSIS — I471 Supraventricular tachycardia: Secondary | ICD-10-CM | POA: Diagnosis not present

## 2014-10-26 NOTE — Patient Instructions (Signed)

## 2014-12-24 ENCOUNTER — Other Ambulatory Visit: Payer: Self-pay | Admitting: Cardiology

## 2015-04-13 IMAGING — CT CT RENAL STONE PROTOCOL
2 of 4 series · 16 of 46 positions shown, 18 images · non-contrast
Comparison: None.

CLINICAL DATA: Acute onset RIGHT flank pain this afternoon.

EXAM:
CT ABDOMEN AND PELVIS WITHOUT CONTRAST
TECHNIQUE: Multidetector CT imaging of the abdomen and pelvis was performed
following the standard protocol without IV contrast.

[Series 2: standard/full over (age)lbs 5.0 · axial · 0.86mm/px · z∈[-584,-170]mm · 13 of 91 slices shown, 15 images]
[im 4/91  soft-tissue]
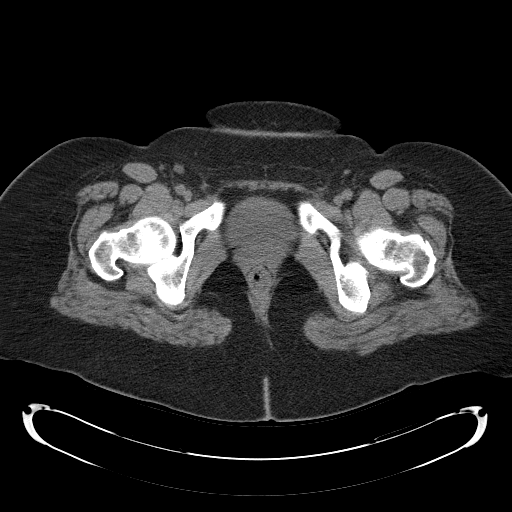
[im 4/91  bone]
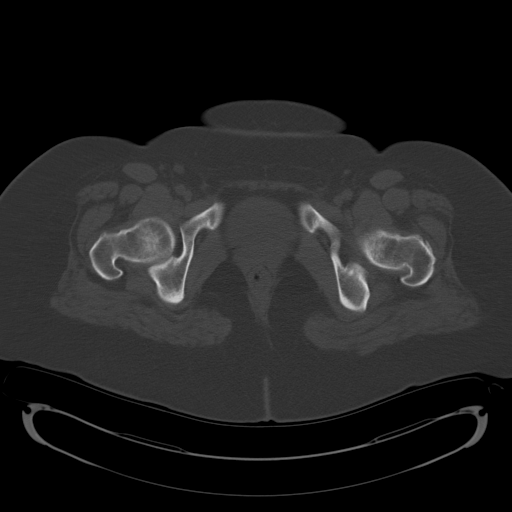
[im 12/91  soft-tissue]
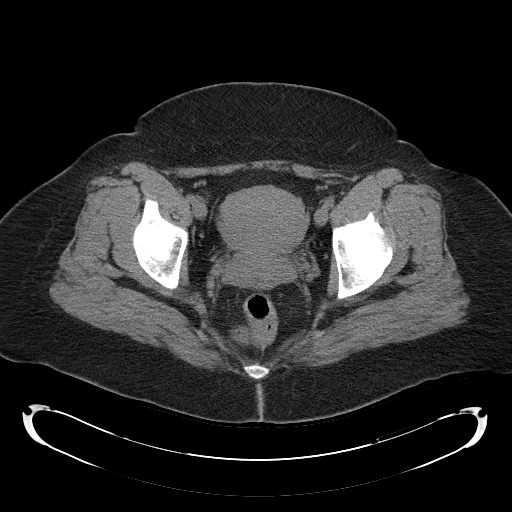
[im 20/91  soft-tissue]
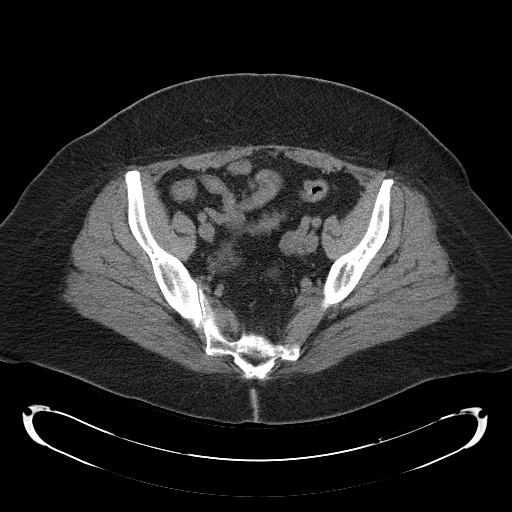
[im 24/91  soft-tissue]
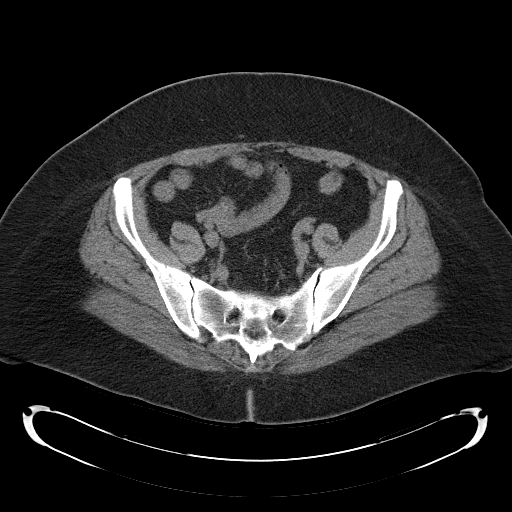
[im 32/91  soft-tissue]
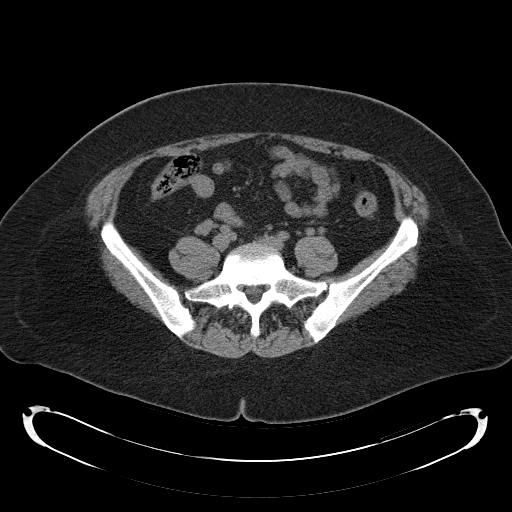
[im 40/91  soft-tissue]
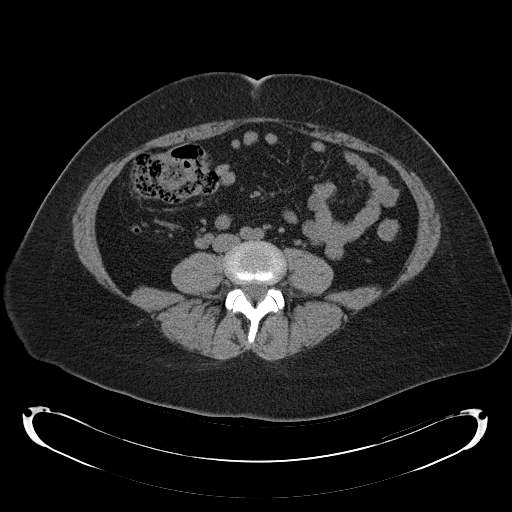
[im 47/91  soft-tissue]
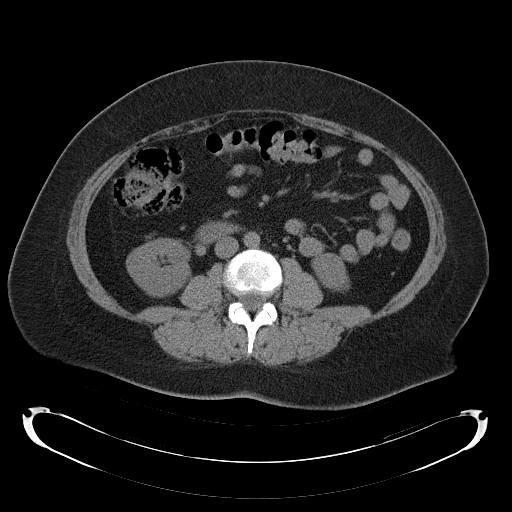
[im 51/91  soft-tissue]
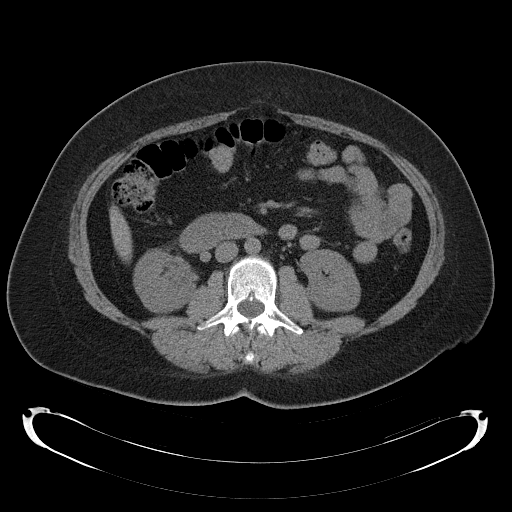
[im 59/91  soft-tissue]
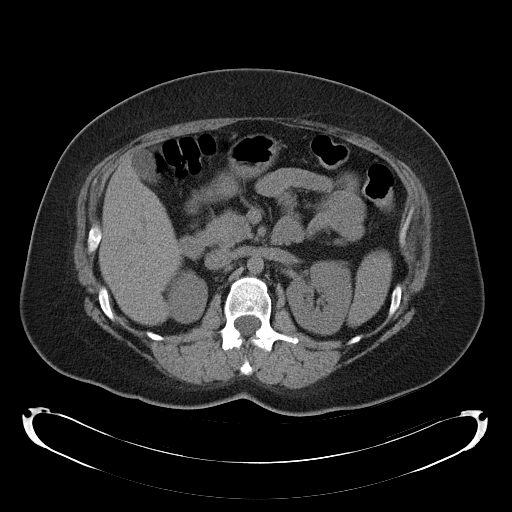
[im 59/91  bone]
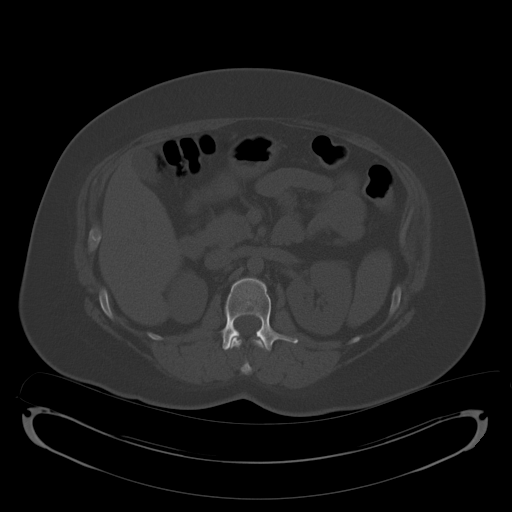
[im 67/91  soft-tissue]
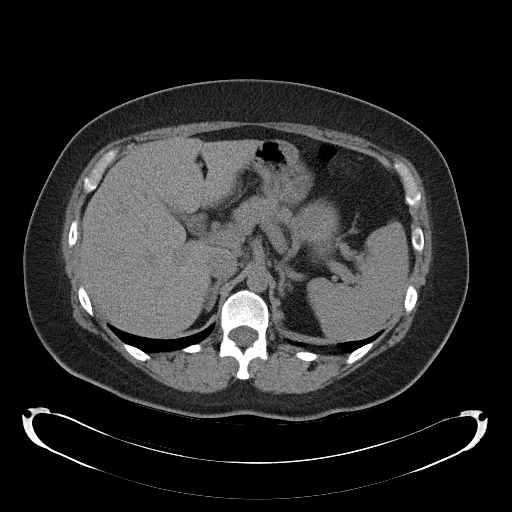
[im 71/91  soft-tissue]
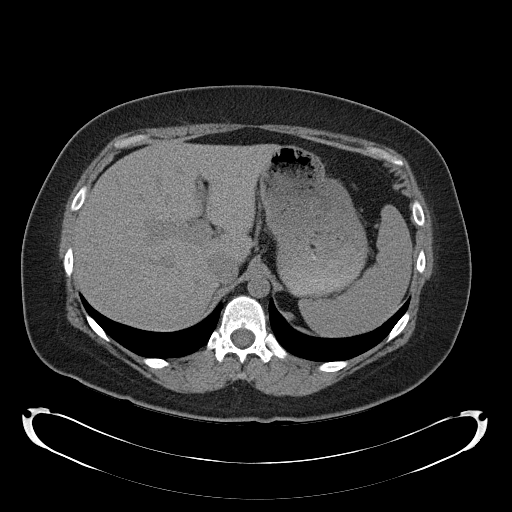
[im 79/91  soft-tissue]
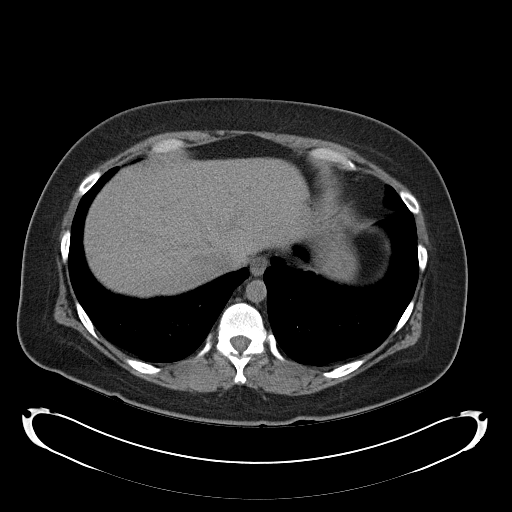
[im 87/91  soft-tissue]
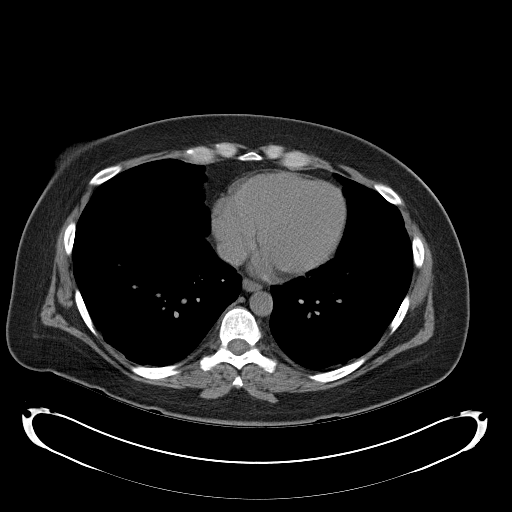

[Series 3: mpr coronal · coronal · 0.73mm/px · 3 of 97 slices shown]
[im 33/97  soft-tissue]
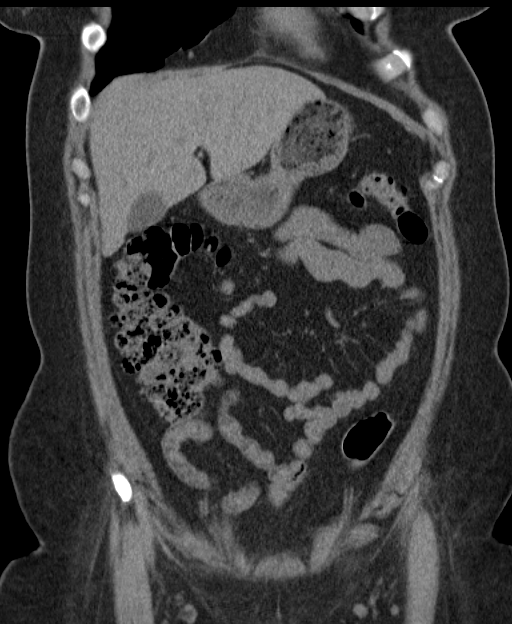
[im 43/97  soft-tissue]
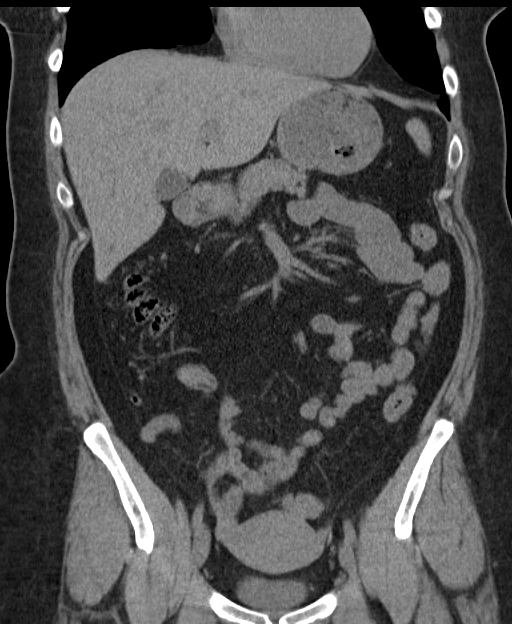
[im 54/97  soft-tissue]
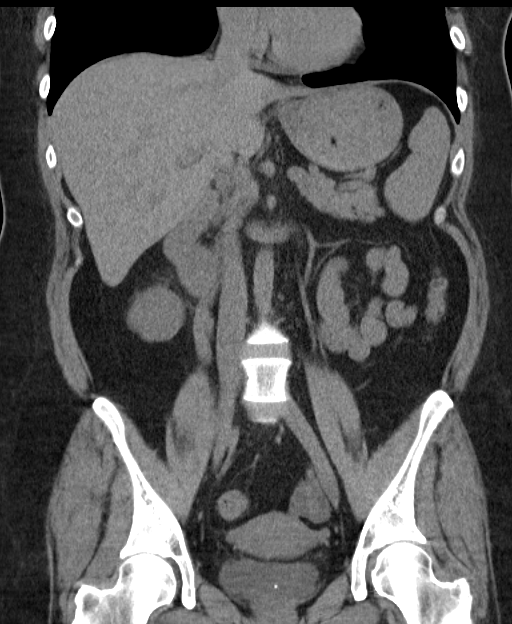

[16 of 46 positions shown; findings below may reference images not displayed]

FINDINGS: LUNG BASES: Included view of the lung bases are clear. The
visualized heart and pericardium are unremarkable.

KIDNEYS/BLADDER: Kidneys are orthotopic, demonstrating normal size
and morphology. Mild RIGHT hydroureteronephrosis. A 2 mm dependent
stone seen in the bladder which is partially distended. No residual
nephrolithiasis.

SOLID ORGANS: The liver, spleen, gallbladder, pancreas and adrenal
glands are unremarkable for this non-contrast examination.

GASTROINTESTINAL TRACT: The stomach, small and large bowel are
normal in course and caliber without inflammatory changes, the
sensitivity may be decreased by lack of enteric contrast. Normal
retrocecal appendix.

PERITONEUM/RETROPERITONEUM: No intraperitoneal free fluid nor free
air. Aortoiliac vessels are normal in course and caliber. No
lymphadenopathy by CT size criteria. Internal reproductive organs
are unremarkable.

SOFT TISSUES/ OSSEOUS STRUCTURES: Nonsuspicious. Small fat
containing ventral hernia.
IMPRESSION: Mild RIGHT hydroureteronephrosis, 2 mm bladder calculus likely
represents recently passed stone without residual nephrolithiasis.

Normal appendix.

  By: Yarely Mandel

## 2015-09-20 ENCOUNTER — Other Ambulatory Visit: Payer: Self-pay | Admitting: Cardiology

## 2015-12-21 ENCOUNTER — Other Ambulatory Visit: Payer: Self-pay | Admitting: Cardiology

## 2016-01-30 ENCOUNTER — Other Ambulatory Visit: Payer: Self-pay | Admitting: Cardiology

## 2016-01-31 ENCOUNTER — Other Ambulatory Visit: Payer: Self-pay | Admitting: *Deleted

## 2016-01-31 ENCOUNTER — Encounter: Payer: Self-pay | Admitting: Internal Medicine

## 2016-01-31 MED ORDER — ATENOLOL 50 MG PO TABS: 50.0000 mg | ORAL_TABLET | Freq: Every day | ORAL | 0 refills | Status: DC

## 2016-02-28 ENCOUNTER — Encounter: Payer: Self-pay | Admitting: Cardiology

## 2016-02-28 ENCOUNTER — Ambulatory Visit (INDEPENDENT_AMBULATORY_CARE_PROVIDER_SITE_OTHER): Payer: Managed Care, Other (non HMO) | Admitting: Cardiology

## 2016-02-28 VITALS — BP 126/78 | HR 85 | Ht 67.0 in | Wt 294.4 lb

## 2016-02-28 DIAGNOSIS — I471 Supraventricular tachycardia: Secondary | ICD-10-CM | POA: Diagnosis not present

## 2016-02-28 LAB — TSH: TSH: 1.78 u[IU]/mL (ref 0.450–4.500)

## 2016-02-28 MED ORDER — ATENOLOL 50 MG PO TABS: 50.0000 mg | ORAL_TABLET | ORAL | 0 refills | Status: DC

## 2016-02-28 NOTE — Progress Notes (Signed)
Cardiology Office Note    Date:  02/28/2016   ID:  Kharter Sestak, DOB 1980/05/06, MRN [id number]  PCP:  Default, Provider, MD  Cardiologist:  Armanda Magic, MD   Chief Complaint  Patient presents with  . Follow-up    svt    History of Present Illness:  Rose Meyer is a 36 y.o. female  with a history of SVT who presents today for followup. She saw Dr. Ladona Ridgel back in 2016 and ablation was recommended but she never followed through. She is concerned about the ablation due to cost.  She says that the palpitations have settled down with the 50mg  of Atenolol which is usually around the time of her next dose.   At the end of the day she will occasionally feel some skipped beats.  She takes her Atenolol around 9:30am and the skipped beats occur around 7pm into the evening.  She denies any chest pain, SOB, DOE.She denies any dizziness or syncope with the palpitations. She occasionally has some mild LE edema.      Past Medical History:  Diagnosis Date  . Dysrhythmia    SVT  . Obese     Past Surgical History:  Procedure Laterality Date  . CESAREAN SECTION  12/18/2011   Procedure: CESAREAN SECTION;  Surgeon: Antionette Char, MD;  Location: [facility];  Service: Obstetrics;  Laterality: N/A;  . WISDOM TOOTH EXTRACTION      Current Medications: Outpatient Medications Prior to Visit  Medication Sig Dispense Refill  . amphetamine-dextroamphetamine (ADDERALL XR) 10 MG 24 hr capsule Take 10 mg by mouth as needed. For ADD  0  . amphetamine-dextroamphetamine (ADDERALL XR) 30 MG 24 hr capsule Take 30 mg by mouth daily.    Marland Kitchen atenolol (TENORMIN) 50 MG tablet Take 1 tablet (50 mg total) by mouth daily. 10 tablet 0  . loratadine (CLARITIN) 10 MG tablet Take 10 mg by mouth daily as needed for allergies (for allergies).    . propranolol (INDERAL) 10 MG tablet Take 10 mg by mouth as needed (for SVT's).     No facility-administered medications prior to visit.      Allergies:   Patient has no known  allergies.   Social History   Social History  . Marital status: Married    Spouse name: N/A  . Number of children: 3  . Years of education: N/A   Social History Main Topics  . Smoking status: Never Smoker  . Smokeless tobacco: Never Used  . Alcohol use No  . Drug use: No  . Sexual activity: Yes    Partners: Male    Birth control/ protection: Injection   Other Topics Concern  . None   Social History Narrative  . None     Family History:  The patient's family history includes Heart attack in her maternal grandfather.   ROS:   Please see the history of present illness.    ROS All other systems reviewed and are negative.  No flowsheet data found.     PHYSICAL EXAM:   VS:  BP 126/78   Pulse 85   Ht 5\' 7"  (1.702 m)   Wt 294 lb 6.4 oz (133.5 kg)   BMI 46.11 kg/m    GEN: Well nourished, well developed, in no acute distress  HEENT: normal  Neck: no JVD, carotid bruits, or masses Cardiac: RRR; no murmurs, rubs, or gallops,no edema.  Intact distal pulses bilaterally.  Respiratory:  clear to auscultation bilaterally, normal work of breathing GI: soft,  nontender, nondistended, + BS MS: no deformity or atrophy  Skin: warm and dry, no rash Neuro:  Alert and Oriented x 3, Strength and sensation are intact Psych: euthymic mood, full affect  Wt Readings from Last 3 Encounters:  02/28/16 294 lb 6.4 oz (133.5 kg)  10/26/14 274 lb (124.3 kg)  05/19/14 279 lb 6.4 oz (126.7 kg)      Studies/Labs Reviewed:   EKG:  EKG is ordered today.  The ekg ordered today demonstrates NSR at 85bpm with no ST changes.  Recent Labs: No results found for requested labs within last 8760 hours.   Lipid Panel No results found for: CHOL, TRIG, HDL, CHOLHDL, VLDL, LDLCALC, LDLDIRECT  Additional studies/ records that were reviewed today include:  none    ASSESSMENT:    1. Supraventricular tachycardia, paroxysmal (HCC)   2. Morbid obesity (HCC)      PLAN:  In order of problems  listed above:  1. SVT - This seems controlled on Atenolol during the day but then has skipped beats in the evening. She is now on [employer] health insurance and would like to think about ablation. She wants to find out how much out of pocket she would need to pay.  In the mean time I am going to increase atenolol to 50mg  qam and 25mg  qPM.   2. Morbid obesity - unfortunately she has gained 20lbs since I saw her in 2016. She has had a rough year with nerve pain in her mouth and headaches and could not exercise.  She then got plantar faciitis and could not walk.  I have strongly encouraged her to try to get into a routine exercise program and diet program.  She would like to be referred to a dietician. She would like her thyroid checked so I will get a TSH today.    Medication Adjustments/Labs and Tests Ordered: Current medicines are reviewed at length with the patient today.  Concerns regarding medicines are outlined above.  Medication changes, Labs and Tests ordered today are listed in the Patient Instructions below.  There are no Patient Instructions on file for this visit.   Signed, Armanda Magicraci Arhaan Chesnut, MD  02/28/2016 10:59 AM    [facility] [address], [place], [place]  [address] Phone: [phone]; Fax: [phone]

## 2016-02-28 NOTE — Patient Instructions (Addendum)
Medication Instructions:  1) INCREASE ATENOLOL to 1 tablet (50 mg) in the morning and 1/2 tablet (25 mg) at night time.  Labwork: Today: TSH  Testing/Procedures: None  Follow-Up: You have been referred to NUTRITION.  Your physician wants you to follow-up in: 1 year with Dr. Mayford Knifeurner. You will receive a reminder letter in the mail two months in advance. If you don't receive a letter, please call our office to schedule the follow-up appointment.   Any Other Special Instructions Will Be Listed Below (If Applicable). Billing will call you to discuss your ablation.  Bariatric Surgery You have so much to gain by losing weight.  You may have already tried every diet and exercise plan imaginable.  And, you may have sought advice from your family physician, too.   Sometimes, in spite of such diligent efforts, you may not be able to achieve long-term results by yourself.  In cases of severe obesity, bariatric or weight loss surgery is a proven method of achieving long-term weight control.  Our Services Our bariatric surgery programs offer our patients new hope and long-term weight-loss solution.  Since introducing our services in 2003, we have conducted more than 2,400 successful procedures.  Our program is designated as a Investment banker, corporateComprehensive Center by the Metabolic and Bariatric Surgery Accreditation and Quality Improvement Program (MBSAQIP), a Child psychotherapistnational accrediting body that sets rigorous patient safety and outcome standards.  Our program is also designated as a Engineer, manufacturing systemsCenter of Excellence by [employer] insurance companies.   Our exceptional weight-loss surgery team specializes in diagnosis, treatment, follow-up care, and ongoing support for our patients with severe weight loss challenges.  We currently offer laparoscopic sleeve gastrectomy, gastric bypass, and adjustable gastric band (LAP-BAND).    Attend our Bariatrics Seminar Choosing to undergo a bariatric procedure is a big decision, and one that should not be  taken lightly.  You now have two options in how you learn about weight-loss surgery - in person or online.  Our objective is to ensure you have all of the information that you need to evaluate the advantages and obligations of this life changing procedure.  Please note that you are not alone in this process, and our experienced team is ready to assist and answer all of your questions.  There are several ways to register for a seminar (either on-line or in person): 1) Call [phone] 2) Go on-line to [facility] and register for either type of seminar.  [email or web address]   If you need a refill on your cardiac medications before your next appointment, please call your pharmacy.

## 2016-03-20 ENCOUNTER — Encounter: Payer: Managed Care, Other (non HMO) | Attending: Cardiology | Admitting: Dietician

## 2016-03-20 ENCOUNTER — Encounter: Payer: Self-pay | Admitting: Dietician

## 2016-03-20 DIAGNOSIS — I471 Supraventricular tachycardia: Secondary | ICD-10-CM | POA: Insufficient documentation

## 2016-03-20 DIAGNOSIS — F909 Attention-deficit hyperactivity disorder, unspecified type: Secondary | ICD-10-CM | POA: Insufficient documentation

## 2016-03-20 DIAGNOSIS — Z713 Dietary counseling and surveillance: Secondary | ICD-10-CM | POA: Insufficient documentation

## 2016-03-20 DIAGNOSIS — M722 Plantar fascial fibromatosis: Secondary | ICD-10-CM | POA: Insufficient documentation

## 2016-03-20 NOTE — Patient Instructions (Signed)
Family meals at the table without electronics or TV. 2 structured snacks a day.  Eat with your children if you are hungry. Consider reading an AssurantEllyn Meyer book on feeding your family. Choose mindfully, eat slowly, stop when you are satisfied. Consider an instapot. Consider Omega-3 supplement. Consider further evaluation regarding depression.

## 2016-03-20 NOTE — Progress Notes (Signed)
  Medical Nutrition Therapy:  Appt start time: 1330 end time:  1415.  Patient scheduled for 03/21/16 but showed up today.  Was able to see patient due to patient no show today.   Assessment:  Primary concerns today: Patient is here alone.  She wants information regarding weight loss, healthy eating for her family as well as nutrition for ADHD.  She has had problems with plantar fascitis last year and gained 20 lbs in the past 1 1/2 years with decreased activity.  Hx includes ADHD and SVT.  She sleeps 6-8 hours per night and is unable to sleep more.  She complains of increased fatigue.  Frustration/depression as below is making it difficult to make changes and healthy meals.  Patient lives with her husband (who works 3rd shift), and 3 daughters ages 298,6, and 764.  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  Previously she worked as a Veterinary surgeonsign language interpretor.  She reports some marriage issues with feeling that everything is her responsibility.  She reports mild depression.   States that she doesn't feel like cooking or cleaning.  Relies on processed or frozen foods most often.  Husband placed on ADHD medication and antidepressant and that has improved home some.  She has ADHD and states that she believes that her daughters have it as well as concerns that her daughter has an auditory processing disorder.  Preferred Learning Style:   No preference indicated   Learning Readiness:   Ready  MEDICATIONS: see list to include Adderall   DIETARY INTAKE:  Usual eating pattern includes 1-3 meals and 2 snacks per day. "Not enough vegetables, fruits, or healthy meats."  "Not motivated to cook a lot this year so relying on a lot of frozen pizza and other prepared food."  Eats for emotional reasons and to relax.  24-hr recall:  B ( AM): Cereal with milk OR protein bar OR cheese sticks  Snk ( AM):   L ( PM): "grab and go", pizza or cereal or sandwich or cottage cheese BUT feeds her children more fruits and veges Snk (  PM): grazing D ( PM): "No sit down dinner." Burrito or frozen pizza or other fast pre prepared food. Snk ( PM): ice cream, graham crackers, peanut butter Beverages: water, whole milk (4-5 cups daily)  Usual physical activity: none  Progress Towards Goal(s):  In progress.   Nutritional Diagnosis:  NB-1.1 Food and nutrition-related knowledge deficit As related to healthy eating for a family.  As evidenced by patient report and diet hx.    Intervention:  Nutrition counseling/education related to mindful eating, importance of a balanced diet, healthy nutrition, sources and benefits of omega-3, exercise.  Family meals at the table without electronics or TV. 2 structured snacks a day.  Eat with your children if you are hungry. Consider reading an AssurantEllyn Satter book on feeding your family. Choose mindfully, eat slowly, stop when you are satisfied. Consider an instapot. Consider Omega-3 supplement. Consider further evaluation regarding depression.  Teaching Method Utilized:  Visual Auditory Hands on  Handouts given during visit include: (mailed)  Breakfast ideas  Snack ideas  Setting a healthy example  Words that help and words that hinder  Kitchen skills based on age for children  Meal plan from Choose my WhoisBlogging.chplate.gov  Games for children to increase activity  Barriers to learning/adherence to lifestyle change: depression, finances, ADHD  Demonstrated degree of understanding via:  Teach Back   Monitoring/Evaluation:  Dietary intake, exercise, and body weight prn.

## 2016-03-21 ENCOUNTER — Ambulatory Visit: Payer: Managed Care, Other (non HMO) | Admitting: Dietician

## 2016-03-24 ENCOUNTER — Other Ambulatory Visit: Payer: Self-pay | Admitting: Cardiology

## 2016-03-24 MED ORDER — ATENOLOL 50 MG PO TABS: 50.0000 mg | ORAL_TABLET | ORAL | 3 refills | Status: DC

## 2016-03-24 MED ORDER — ATENOLOL 50 MG PO TABS: 50.0000 mg | ORAL_TABLET | ORAL | 0 refills | Status: DC

## 2016-03-25 ENCOUNTER — Other Ambulatory Visit: Payer: Self-pay | Admitting: *Deleted

## 2016-03-25 MED ORDER — ATENOLOL 50 MG PO TABS: 50.0000 mg | ORAL_TABLET | ORAL | 3 refills | Status: DC

## 2017-03-19 ENCOUNTER — Other Ambulatory Visit: Payer: Self-pay | Admitting: Cardiology

## 2017-10-16 ENCOUNTER — Other Ambulatory Visit: Payer: Self-pay | Admitting: Cardiology

## 2017-11-09 ENCOUNTER — Other Ambulatory Visit: Payer: Self-pay | Admitting: Cardiology

## 2018-08-06 ENCOUNTER — Other Ambulatory Visit: Payer: Self-pay | Admitting: Cardiology

## 2018-08-06 MED ORDER — ATENOLOL 50 MG PO TABS: ORAL_TABLET | ORAL | 1 refills | Status: AC

## 2018-08-06 NOTE — Telephone Encounter (Signed)
 *  STAT* If patient is at the pharmacy, call can be transferred to refill team.   1. Which medications need to be refilled? (please list name of each medication and dose if known) atenolol (TENORMIN) 50 MG tablet  2. Which pharmacy/location (including street and city if local pharmacy) is medication to be sent to? [facility] on [address]  3. Do they need a 30 day or 90 day supply? 90  Patient almost out

## 2018-08-06 NOTE — Telephone Encounter (Signed)
Pt's medication was sent to pt's pharmacy as requested. Confirmation received.  °

## 2018-09-26 NOTE — Progress Notes (Deleted)
Cardiology Office Note:    Date:  09/26/2018   ID:  Rose Meyer, DOB January 23, 1981, MRN [id number]  PCP:  Default, Provider, MD  Cardiologist:  No primary care provider on file.    Referring MD: No ref. provider found   No chief complaint on file.   History of Present Illness:    Rose Meyer is a 38 y.o. female with a hx of SVT.  She saw Dr. Lovena Le back in 2016 and ablation was recommended but she never followed through and palpitations improved with BB.  She also has a hx of morbid obesity and has had problems in the past with planta fasciitis restricting her exercise ability.  She has not been seen in over 2 years. She is here today for followup and is doing well.  She denies any chest pain or pressure, SOB, DOE, PND, orthopnea, LE edema, dizziness, palpitations or syncope. She is compliant with her meds and is tolerating meds with no SE.     Past Medical History:  Diagnosis Date  . Dysrhythmia    SVT  . Obese     Past Surgical History:  Procedure Laterality Date  . CESAREAN SECTION  12/18/2011   Procedure: CESAREAN SECTION;  Surgeon: Lahoma Crocker, MD;  Location: [facility];  Service: Obstetrics;  Laterality: N/A;  . WISDOM TOOTH EXTRACTION      Current Medications: No outpatient medications have been marked as taking for the 09/27/18 encounter (Appointment) with Sueanne Margarita, MD.     Allergies:   Patient has no known allergies.   Social History   Socioeconomic History  . Marital status: Married    Spouse name: Not on file  . Number of children: 3  . Years of education: Not on file  . Highest education level: Not on file  Occupational History  . Not on file  Social Needs  . Financial resource strain: Not on file  . Food insecurity    Worry: Not on file    Inability: Not on file  . Transportation needs    Medical: Not on file    Non-medical: Not on file  Tobacco Use  . Smoking status: Never Smoker  . Smokeless tobacco: Never Used  Substance and Sexual  Activity  . Alcohol use: No  . Drug use: No  . Sexual activity: Yes    Partners: Male    Birth control/protection: Injection  Lifestyle  . Physical activity    Days per week: Not on file    Minutes per session: Not on file  . Stress: Not on file  Relationships  . Social Herbalist on phone: Not on file    Gets together: Not on file    Attends religious service: Not on file    Active member of club or organization: Not on file    Attends meetings of clubs or organizations: Not on file    Relationship status: Not on file  Other Topics Concern  . Not on file  Social History Narrative  . Not on file     Family History: The patient's family history includes Heart attack in her maternal grandfather.  ROS:   Please see the history of present illness.    ROS  All other systems reviewed and negative.   EKGs/Labs/Other Studies Reviewed:    The following studies were reviewed today: none  EKG:  EKG is  ordered today.  The ekg ordered today demonstrates ***  Recent Labs: No results found for requested  labs within last 8760 hours.   Recent Lipid Panel No results found for: CHOL, TRIG, HDL, CHOLHDL, VLDL, LDLCALC, LDLDIRECT  Physical Exam:    VS:  There were no vitals taken for this visit.    Wt Readings from Last 3 Encounters:  03/20/16 294 lb (133.4 kg)  02/28/16 294 lb 6.4 oz (133.5 kg)  10/26/14 274 lb (124.3 kg)     GEN:  Well nourished, well developed in no acute distress HEENT: Normal NECK: No JVD; No carotid bruits LYMPHATICS: No lymphadenopathy CARDIAC: RRR, no murmurs, rubs, gallops RESPIRATORY:  Clear to auscultation without rales, wheezing or rhonchi  ABDOMEN: Soft, non-tender, non-distended MUSCULOSKELETAL:  No edema; No deformity  SKIN: Warm and dry NEUROLOGIC:  Alert and oriented x 3 PSYCHIATRIC:  Normal affect   ASSESSMENT:    1. Supraventricular tachycardia, paroxysmal (HCC)   2. Morbid obesity (HCC)    PLAN:    In order of  problems listed above:  1.  SVT - her symptoms are controlled on Atenolol.  Ablation has been recommended in the past but she declined due to cost.   2.  Morbid Obesity - I have encouraged her to get into a routine exercise program and cut back on carbs and portions.    Medication Adjustments/Labs and Tests Ordered: Current medicines are reviewed at length with the patient today.  Concerns regarding medicines are outlined above.  No orders of the defined types were placed in this encounter.  No orders of the defined types were placed in this encounter.   Signed, Armanda Magicraci Turner, MD  09/26/2018 9:28 PM    [facility]

## 2018-09-27 ENCOUNTER — Ambulatory Visit: Payer: Managed Care, Other (non HMO) | Admitting: Cardiology

## 2022-04-11 ENCOUNTER — Inpatient Hospital Stay (HOSPITAL_COMMUNITY): Admission: RE | Admit: 2022-04-11 | Payer: BC Managed Care – PPO | Source: Ambulatory Visit

## 2022-04-11 DIAGNOSIS — Z1231 Encounter for screening mammogram for malignant neoplasm of breast: Secondary | ICD-10-CM
